# Patient Record
Sex: Female | Born: 1980 | Race: Black or African American | Hispanic: No | Marital: Married | Smoking: Never smoker
Health system: Southern US, Community
[De-identification: ages and names within clinical notes are randomized; demographics above are authoritative.]

## PROBLEM LIST (undated history)

## (undated) ENCOUNTER — Inpatient Hospital Stay (HOSPITAL_COMMUNITY): Payer: Self-pay

## (undated) DIAGNOSIS — Z6791 Unspecified blood type, Rh negative: Secondary | ICD-10-CM

## (undated) DIAGNOSIS — O210 Mild hyperemesis gravidarum: Secondary | ICD-10-CM

## (undated) DIAGNOSIS — O139 Gestational [pregnancy-induced] hypertension without significant proteinuria, unspecified trimester: Secondary | ICD-10-CM

## (undated) DIAGNOSIS — D219 Benign neoplasm of connective and other soft tissue, unspecified: Secondary | ICD-10-CM

## (undated) DIAGNOSIS — E669 Obesity, unspecified: Secondary | ICD-10-CM

## (undated) HISTORY — PX: NO PAST SURGERIES: SHX2092

---

## 2011-12-30 ENCOUNTER — Encounter: Payer: Self-pay | Admitting: *Deleted

## 2011-12-30 ENCOUNTER — Emergency Department
Admission: EM | Admit: 2011-12-30 | Discharge: 2011-12-30 | Disposition: A | Discharge status: Home / Self Care | Payer: Self-pay | Source: Home / Self Care | Attending: Emergency Medicine | Admitting: Emergency Medicine

## 2011-12-30 DIAGNOSIS — N912 Amenorrhea, unspecified: Secondary | ICD-10-CM

## 2011-12-30 LAB — POCT URINE PREGNANCY: Preg Test, Ur: POSITIVE

## 2011-12-30 NOTE — ED Provider Notes (Signed)
History     CSN: [id number]  Arrival date & time 12/30/11  1830   First MD Initiated Contact with Patient 12/30/11 1910      Chief Complaint  Patient presents with  . Possible Pregnancy    HPI 31 year old female, married in June. Gravida 0 para 0. She and husband are not using any contraception.  Complains of amenorrhea. Last menstrual period started 11/29/11 and was the normal 4 days. She normally has her periods very regularly q. 28 days, and it's been around 31 days since the start of her last menstrual period.--She feels she is pregnant. She did a home pregnancy test today that was positive, and she requests confirmation of this today.  She does not have an OB/GYN at this time. Symptoms include some mild breast tenderness, increased hunger. No nausea or vomiting. History reviewed. No pertinent past medical history.  History reviewed. No pertinent past surgical history.  History reviewed. No pertinent family history.  History  Substance Use Topics  . Smoking status: Never Smoker   . Smokeless tobacco: Not on file  . Alcohol Use: No    OB History    Grav Para Term Preterm Abortions TAB SAB Ect Mult Living                  Review of Systems  All other systems reviewed and are negative.    Allergies  Review of patient's allergies indicates no known allergies.  Home Medications  No current outpatient prescriptions on file.  BP 136/83  Pulse 76  Temp 98 F (36.7 C) (Oral)  Resp 18  Ht 5\' 4"  (1.626 m)  Wt 244 lb (110.678 kg)  BMI 41.88 kg/m2  SpO2 100%  LMP 11/29/2011  Physical Exam  Nursing note and vitals reviewed. Constitutional: She is oriented to person, place, and time. She appears well-developed and well-nourished. No distress.  HENT:  Head: Normocephalic and atraumatic.  Eyes: Conjunctivae and EOM are normal. Pupils are equal, round, and reactive to light. No scleral icterus.  Neck: Normal range of motion.  Cardiovascular: Normal rate.     Pulmonary/Chest: Effort normal.  Abdominal: She exhibits no distension.  Musculoskeletal: Normal range of motion.  Neurological: She is alert and oriented to person, place, and time.  Skin: Skin is warm.  Psychiatric: She has a normal mood and affect.   Pleasant female, no distress. ED Course  Procedures (including critical care time)   Labs Reviewed  POCT URINE PREGNANCY     MDM  Urine pregnancy test here in our office is positive. We discussed at length today, 30 minute visit today, more than half for discussion and counseling. She states she is very happy that she's pregnant. Questions invited and answered. Wrote a prescription for her to start on prenatal vitamins with folic acid, one daily. Gave her name and phone number of OB/GYN at [facility], and she will call to get an appointment within one to 2 weeks. If any problems before seeing OB/GYN, return to urgent care or go to ED. She voiced understanding and agreement with above plans.        Lajean Manes, MD 12/30/11 2127

## 2011-12-30 NOTE — ED Notes (Signed)
The pt is here today for pregnancy testing. She reports that she has taken several at home test that were positive.

## 2012-01-27 ENCOUNTER — Encounter: Payer: Self-pay | Admitting: Family

## 2012-01-27 ENCOUNTER — Ambulatory Visit (INDEPENDENT_AMBULATORY_CARE_PROVIDER_SITE_OTHER): Payer: Self-pay | Admitting: Family

## 2012-01-27 ENCOUNTER — Other Ambulatory Visit (HOSPITAL_COMMUNITY)
Admission: RE | Admit: 2012-01-27 | Discharge: 2012-01-27 | Disposition: A | Discharge status: Home / Self Care | Payer: Medicaid Other | Source: Ambulatory Visit | Attending: Family | Admitting: Family

## 2012-01-27 VITALS — BP 114/75 | Wt 242.0 lb

## 2012-01-27 DIAGNOSIS — Z3480 Encounter for supervision of other normal pregnancy, unspecified trimester: Secondary | ICD-10-CM

## 2012-01-27 DIAGNOSIS — Z1151 Encounter for screening for human papillomavirus (HPV): Secondary | ICD-10-CM

## 2012-01-27 DIAGNOSIS — Z113 Encounter for screening for infections with a predominantly sexual mode of transmission: Secondary | ICD-10-CM | POA: Insufficient documentation

## 2012-01-27 DIAGNOSIS — Z3490 Encounter for supervision of normal pregnancy, unspecified, unspecified trimester: Secondary | ICD-10-CM | POA: Insufficient documentation

## 2012-01-27 DIAGNOSIS — Z124 Encounter for screening for malignant neoplasm of cervix: Secondary | ICD-10-CM

## 2012-01-27 DIAGNOSIS — Z01419 Encounter for gynecological examination (general) (routine) without abnormal findings: Secondary | ICD-10-CM | POA: Insufficient documentation

## 2012-01-27 NOTE — Progress Notes (Signed)
Exam  New OB visit; reviewed practice information and OB visit schedule; discussed genetic screening, pt undecided, will decide in next two weeks and will let us know.  Obtain OB panel today.  Plans to switch prenatal vitamins to gummies and taking at night.    Uterine Size: size equals dates  Pelvic Exam:    Perineum: No Hemorrhoids, Normal Perineum   Vulva: normal   Vagina:  normal mucosa, normal discharge, no palpable nodules   pH: Not done   Cervix: no bleeding following Pap, no cervical motion tenderness and no lesions   Adnexa: normal adnexa and no mass, fullness, tenderness   Bony Pelvis: Adequate  System: Breast:  Large; no nipple retraction or dimpling, No nipple discharge or bleeding, No axillary or supraclavicular adenopathy, Normal to palpation without dominant masses   Skin: normal coloration and turgor, no rashes    Neurologic: negative   Extremities: normal strength, tone, and muscle mass   HEENT neck supple with midline trachea and thyroid without masses   Mouth/Teeth mucous membranes moist, pharynx normal without lesions   Neck supple and no masses   Cardiovascular: regular rate and rhythm, no murmurs or gallops   Respiratory:  appears well, vitals normal, no respiratory distress, acyanotic, normal RR, neck free of mass or lymphadenopathy, chest clear, no wheezing, crepitations, rhonchi, normal symmetric air entry   Abdomen: soft, non-tender; bowel sounds normal; no masses,  no organomegaly   Urinary: urethral meatus normal

## 2012-01-27 NOTE — Progress Notes (Signed)
p-73 

## 2012-01-28 LAB — OBSTETRIC PANEL
Basophils Absolute: 0 10*3/uL (ref 0.0–0.1)
Eosinophils Relative: 1 % (ref 0–5)
Lymphocytes Relative: 17 % (ref 12–46)
Neutro Abs: 5.6 10*3/uL (ref 1.7–7.7)
Platelets: 358 10*3/uL (ref 150–400)
RDW: 14.6 % (ref 11.5–15.5)
Rubella: 29.3 IU/mL — ABNORMAL HIGH
WBC: 7.5 10*3/uL (ref 4.0–10.5)

## 2012-01-28 LAB — SICKLE CELL SCREEN: Sickle Cell Screen: NEGATIVE

## 2012-01-29 ENCOUNTER — Encounter: Payer: Self-pay | Admitting: Family

## 2012-01-29 DIAGNOSIS — O26899 Other specified pregnancy related conditions, unspecified trimester: Secondary | ICD-10-CM

## 2012-01-29 DIAGNOSIS — Z6791 Unspecified blood type, Rh negative: Secondary | ICD-10-CM | POA: Insufficient documentation

## 2012-02-01 LAB — CYSTIC FIBROSIS DIAGNOSTIC STUDY: Date of Birth: [birth year]

## 2012-02-03 ENCOUNTER — Encounter: Payer: Self-pay | Admitting: Family

## 2012-02-03 DIAGNOSIS — IMO0002 Reserved for concepts with insufficient information to code with codable children: Secondary | ICD-10-CM | POA: Insufficient documentation

## 2012-02-08 ENCOUNTER — Telehealth: Payer: Self-pay | Admitting: *Deleted

## 2012-02-08 DIAGNOSIS — O219 Vomiting of pregnancy, unspecified: Secondary | ICD-10-CM

## 2012-02-08 MED ORDER — ONDANSETRON HCL 8 MG PO TABS: 8.0000 mg | ORAL_TABLET | Freq: Three times a day (TID) | ORAL | Status: DC | PRN

## 2012-02-08 NOTE — Telephone Encounter (Signed)
Pt called with c/o's nausea and vomiting for 3 days.  She said she has tried everything on the list of approved medications and nothing has worked.  She states that she is afebrile and is voiding.  She is requesting something for N and V.  RX for Zofran sent to [facility].  Informed pt that if she continues to have issues after taking the Zofran, we would suggest going to [facility] for possible IV fluids.

## 2012-02-23 ENCOUNTER — Encounter: Payer: Self-pay | Admitting: Obstetrics & Gynecology

## 2012-02-23 ENCOUNTER — Ambulatory Visit: Payer: Medicaid Other | Admitting: Obstetrics & Gynecology

## 2012-02-23 ENCOUNTER — Ambulatory Visit (INDEPENDENT_AMBULATORY_CARE_PROVIDER_SITE_OTHER): Payer: Medicaid Other | Admitting: Obstetrics & Gynecology

## 2012-02-23 VITALS — BP 117/68 | HR 77 | Resp 16 | Ht 65.0 in | Wt 244.0 lb

## 2012-02-23 VITALS — BP 117/68 | Temp 98.4°F | Wt 244.0 lb

## 2012-02-23 DIAGNOSIS — IMO0002 Reserved for concepts with insufficient information to code with codable children: Secondary | ICD-10-CM

## 2012-02-23 DIAGNOSIS — R87612 Low grade squamous intraepithelial lesion on cytologic smear of cervix (LGSIL): Secondary | ICD-10-CM

## 2012-02-23 DIAGNOSIS — Z3480 Encounter for supervision of other normal pregnancy, unspecified trimester: Secondary | ICD-10-CM

## 2012-02-23 NOTE — Progress Notes (Signed)
p-77 

## 2012-02-23 NOTE — Progress Notes (Signed)
Patient ID: Rebekah Fowler, female   DOB: [birth year], 31 y.o.   MRN: [id number]  Chief Complaint  Patient presents with  . Colposcopy    pt is pregnant    HPI Rebekah Fowler is a 31 y.o. female.  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  No prior abnml paps History reviewed. No pertinent past medical history.  History reviewed. No pertinent past surgical history.  Family History  Problem Relation Age of Onset  . Hypertension Mother   . Cancer Sister 32    breast  . Diabetes Father     Social History History  Substance Use Topics  . Smoking status: Never Smoker   . Smokeless tobacco: Never Used  . Alcohol Use: No    Allergies  Allergen Reactions  . Latex Itching and Rash    Current Outpatient Prescriptions  Medication Sig Dispense Refill  . ondansetron (ZOFRAN) 8 MG tablet Take 1 tablet (8 mg total) by mouth every 8 (eight) hours as needed for nausea.  20 tablet  0  . PRENATAL VITAMINS PO Take by mouth daily.        Review of Systems Review of Systems  Blood pressure 117/68, pulse 77, resp. rate 16, height 5\' 5"  (1.651 m), weight 244 lb (110.678 kg), last menstrual period 11/29/2011.  Physical Exam Physical Exam Nml BP + FH No problems with pregnancy  Assessment    Procedure Details  The risks and benefits of the procedure and Written informed consent obtained.  Speculum placed in vagina and excellent visualization of cervix achieved, cervix swabbed x 3 with acetic acid solution.  Specimens: biopsy at 6 o'clock  Complications: none.  Plan    Specimens labelled and sent to Pathology. Return to discuss Pathology at next prenatal visit. Pt refuses genetic testing. Needs anatomy at 18 weeks.      Rebekah Besecker H. 02/23/2012, 3:38 PM

## 2012-02-23 NOTE — Addendum Note (Signed)
Addended by: Granville Lewis on: 02/23/2012 04:35 PM   Modules accepted: Orders

## 2012-03-01 ENCOUNTER — Encounter: Payer: Self-pay | Admitting: Obstetrics & Gynecology

## 2012-03-01 ENCOUNTER — Other Ambulatory Visit: Payer: Self-pay | Admitting: *Deleted

## 2012-03-01 DIAGNOSIS — O219 Vomiting of pregnancy, unspecified: Secondary | ICD-10-CM

## 2012-03-01 MED ORDER — ONDANSETRON HCL 8 MG PO TABS: 8.0000 mg | ORAL_TABLET | Freq: Three times a day (TID) | ORAL | Status: DC | PRN

## 2012-03-01 NOTE — Telephone Encounter (Signed)
Pt called requesting a RF on Zofran as she had ran out and cannot keep anything down.  Pt denies any diarrhea or fever.  RF given for Zofran and instructed pt to call if after 12 hours of taking the Zofran she still cannot keep anything down.

## 2012-03-22 ENCOUNTER — Encounter: Payer: Self-pay | Admitting: Obstetrics & Gynecology

## 2012-03-22 ENCOUNTER — Ambulatory Visit (INDEPENDENT_AMBULATORY_CARE_PROVIDER_SITE_OTHER): Payer: Medicaid Other | Admitting: Obstetrics & Gynecology

## 2012-03-22 VITALS — BP 124/79 | Temp 96.9°F | Wt 245.0 lb

## 2012-03-22 DIAGNOSIS — Z23 Encounter for immunization: Secondary | ICD-10-CM

## 2012-03-22 DIAGNOSIS — Z3480 Encounter for supervision of other normal pregnancy, unspecified trimester: Secondary | ICD-10-CM

## 2012-03-22 DIAGNOSIS — Z3490 Encounter for supervision of normal pregnancy, unspecified, unspecified trimester: Secondary | ICD-10-CM

## 2012-03-22 DIAGNOSIS — O99210 Obesity complicating pregnancy, unspecified trimester: Secondary | ICD-10-CM | POA: Insufficient documentation

## 2012-03-22 NOTE — Progress Notes (Signed)
p-88  Pt is undecided about Quad screen

## 2012-03-22 NOTE — Progress Notes (Signed)
Routine visit. No problems. She reports FM. She would like a Quad screen at next visit. I will schedule an anatomy u/s in 2 weeks. Flu vaccine today. We have discussed a recommended weight gain of 15#. She will get an early 1 hour glucola at next visit.

## 2012-03-24 ENCOUNTER — Emergency Department (HOSPITAL_COMMUNITY): Payer: Medicaid Other

## 2012-03-24 ENCOUNTER — Encounter (HOSPITAL_COMMUNITY): Payer: Self-pay | Admitting: *Deleted

## 2012-03-24 ENCOUNTER — Emergency Department (HOSPITAL_COMMUNITY)
Admission: EM | Admit: 2012-03-24 | Discharge: 2012-03-24 | Disposition: A | Discharge status: Home / Self Care | Payer: Medicaid Other | Attending: Emergency Medicine | Admitting: Emergency Medicine

## 2012-03-24 DIAGNOSIS — R1010 Upper abdominal pain, unspecified: Secondary | ICD-10-CM

## 2012-03-24 DIAGNOSIS — R112 Nausea with vomiting, unspecified: Secondary | ICD-10-CM | POA: Insufficient documentation

## 2012-03-24 DIAGNOSIS — O99891 Other specified diseases and conditions complicating pregnancy: Secondary | ICD-10-CM | POA: Insufficient documentation

## 2012-03-24 DIAGNOSIS — Z3490 Encounter for supervision of normal pregnancy, unspecified, unspecified trimester: Secondary | ICD-10-CM

## 2012-03-24 DIAGNOSIS — R109 Unspecified abdominal pain: Secondary | ICD-10-CM | POA: Insufficient documentation

## 2012-03-24 LAB — CBC WITH DIFFERENTIAL/PLATELET
Basophils Absolute: 0 10*3/uL (ref 0.0–0.1)
Eosinophils Relative: 1 % (ref 0–5)
HCT: 34.8 % — ABNORMAL LOW (ref 36.0–46.0)
Lymphocytes Relative: 14 % (ref 12–46)
MCV: 78.7 fL (ref 78.0–100.0)
Monocytes Absolute: 0.8 10*3/uL (ref 0.1–1.0)
Monocytes Relative: 8 % (ref 3–12)
RDW: 13.7 % (ref 11.5–15.5)
WBC: 10.4 10*3/uL (ref 4.0–10.5)

## 2012-03-24 LAB — COMPREHENSIVE METABOLIC PANEL
BUN: 4 mg/dL — ABNORMAL LOW (ref 6–23)
CO2: 20 mEq/L (ref 19–32)
Calcium: 8.5 mg/dL (ref 8.4–10.5)
Creatinine, Ser: 0.52 mg/dL (ref 0.50–1.10)
GFR calc Af Amer: >90 mL/min (ref >90)
GFR calc non Af Amer: >90 mL/min (ref >90)
Glucose, Bld: 104 mg/dL — ABNORMAL HIGH (ref 70–99)

## 2012-03-24 LAB — AMYLASE: Amylase: 81 U/L (ref 0–105)

## 2012-03-24 LAB — LIPASE, BLOOD: Lipase: 18 U/L (ref 11–59)

## 2012-03-24 MED ORDER — MORPHINE SULFATE 4 MG/ML IJ SOLN
4.0000 mg | Freq: Once | INTRAMUSCULAR | Status: AC
  Administered 2012-03-24: 4 mg via INTRAVENOUS
  Filled 2012-03-24: qty 1

## 2012-03-24 MED ORDER — ONDANSETRON HCL 4 MG/2ML IJ SOLN
4.0000 mg | Freq: Once | INTRAMUSCULAR | Status: AC
  Administered 2012-03-24: 4 mg via INTRAVENOUS
  Filled 2012-03-24: qty 2

## 2012-03-24 NOTE — ED Notes (Signed)
Pt appears in great pain.  Hyperventilating.  Dr Lorenso Courier notified.

## 2012-03-24 NOTE — ED Provider Notes (Signed)
History     CSN: [id number]  Arrival date & time 03/24/12  0220   First MD Initiated Contact with Patient 03/24/12 0225      Chief Complaint  Patient presents with  . Abdominal Pain    (Consider location/radiation/quality/duration/timing/severity/associated sxs/prior treatment) HPI Comments: [name] presents ambulatory with her husband for evaluation.  She is a G1P0 and states she is 4 mo into her pregnancy.  She denies vaginal bleeding, fluid leakage, discharges, lower abdominal or back pain, dysuria, or pyuria.  She states she has had no complications with this pregnancy.  She reports a severe cramping upper abdominal pain associated with nausea.  She vomited once and describes the emesis as nonbloody and nonbilious.  She denies chest pain or respiratory complaints.  Patient is a 31 y.o. female presenting with abdominal pain. The history is provided by the patient. No language interpreter was used.  Abdominal Pain The primary symptoms of the illness include abdominal pain, nausea and vomiting. The primary symptoms of the illness do not include fever, fatigue, shortness of breath, diarrhea, hematemesis, hematochezia, dysuria, vaginal discharge or vaginal bleeding. The current episode started 1 to 2 hours ago. The onset of the illness was gradual. The problem has been gradually worsening.  The patient states that she believes she is currently pregnant. The patient has not had a change in bowel habit. Risk factors: no previous abdominal surgeries. Symptoms associated with the illness do not include chills, anorexia, diaphoresis, heartburn, constipation, urgency, hematuria, frequency or back pain. Significant associated medical issues do not include PUD, GERD, inflammatory bowel disease, diabetes, sickle cell disease, gallstones, liver disease, substance abuse, diverticulitis, HIV or cardiac disease.    History reviewed. No pertinent past medical history.  History reviewed. No pertinent  past surgical history.  Family History  Problem Relation Age of Onset  . Hypertension Mother   . Cancer Sister 62    breast  . Diabetes Father     History  Substance Use Topics  . Smoking status: Never Smoker   . Smokeless tobacco: Never Used  . Alcohol Use: No    OB History    Grav Para Term Preterm Abortions TAB SAB Ect Mult Living   1               Review of Systems  Constitutional: Negative for fever, chills, diaphoresis and fatigue.  HENT: Negative.   Eyes: Negative.   Respiratory: Negative for shortness of breath.   Gastrointestinal: Positive for nausea, vomiting and abdominal pain. Negative for heartburn, diarrhea, constipation, hematochezia, anorexia and hematemesis.  Genitourinary: Negative for dysuria, urgency, frequency, hematuria, vaginal bleeding, vaginal discharge, difficulty urinating and pelvic pain.  Musculoskeletal: Negative for myalgias, back pain, joint swelling, arthralgias and gait problem.  Skin: Negative for color change, pallor, rash and wound.  Neurological: Negative.  Negative for dizziness, syncope, weakness and light-headedness.  Hematological: Negative for adenopathy. Does not bruise/bleed easily.  Psychiatric/Behavioral: Negative.     Allergies  Latex  Home Medications   Current Outpatient Rx  Name  Route  Sig  Dispense  Refill  . ONDANSETRON HCL 8 MG PO TABS   Oral   Take 1 tablet (8 mg total) by mouth every 8 (eight) hours as needed for nausea.   30 tablet   1   . PRENATAL VITAMINS PO   Oral   Take 1 tablet by mouth daily.            BP 108/65  Pulse 77  Temp 97.5  F (36.4 C) (Oral)  Resp 18  SpO2 100%  LMP 11/29/2011  Physical Exam  Nursing note and vitals reviewed. Constitutional: She is oriented to person, place, and time. She appears well-developed and well-nourished. No distress.  HENT:  Head: Normocephalic and atraumatic.  Right Ear: External ear normal.  Left Ear: External ear normal.  Nose: Nose normal.    Mouth/Throat: Oropharynx is clear and moist. No oropharyngeal exudate.  Eyes: Conjunctivae normal are normal. Pupils are equal, round, and reactive to light. Right eye exhibits no discharge. Left eye exhibits no discharge. No scleral icterus.  Neck: Normal range of motion. Neck supple. No JVD present. No tracheal deviation present.  Cardiovascular: Normal rate, normal heart sounds and intact distal pulses.  Exam reveals no gallop and no friction rub.   No murmur heard. Pulmonary/Chest: Effort normal and breath sounds normal. No stridor. No respiratory distress. She has no wheezes. She has no rales. She exhibits no tenderness.  Abdominal: Soft. Bowel sounds are normal. She exhibits no distension and no mass. There is no hepatosplenomegaly. There is tenderness in the right upper quadrant, epigastric area and left upper quadrant. There is no rigidity, no rebound, no guarding, no CVA tenderness and no tenderness at McBurney's point. No hernia.  Musculoskeletal: Normal range of motion. She exhibits no edema and no tenderness.  Lymphadenopathy:    She has no cervical adenopathy.  Neurological: She is alert and oriented to person, place, and time.  Skin: Skin is warm and dry. No rash noted. She is not diaphoretic. No erythema. No pallor.  Psychiatric: She has a normal mood and affect. Her behavior is normal.    ED Course  Procedures (including critical care time)  Labs Reviewed  CBC WITH DIFFERENTIAL - Abnormal; Notable for the following:    Hemoglobin 11.9 (*)     HCT 34.8 (*)     Neutro Abs 7.9 (*)     All other components within normal limits  COMPREHENSIVE METABOLIC PANEL - Abnormal; Notable for the following:    Sodium 133 (*)     Glucose, Bld 104 (*)     BUN 4 (*)     Albumin 3.0 (*)     Total Bilirubin 0.1 (*)     All other components within normal limits  LIPASE, BLOOD  AMYLASE   No results found.   No diagnosis found.    MDM  Pt is 4 mos pregnant (with already  established Ob care) and presents for evaluation of upper abdominal pain since 0100.  She appears stable but  Uncomfortable.  She denies bleeding and has no lowe abdominal tenderness on exam.  There is a fetal heart beat in the 140s also.  Will treat her discomfort while awaiting the results of routine belly labs.  Secondary to the location of the pain, will likely obtain an abdominal ultrasound to assess for gallstones or cholecystitis.  1610.  Pt stable, Pain is resolved.  Plan discharge home.  She has a single live IUP at 18 wk 1 day and no evidence of gallstones or cholecystitis.  There is also no evidence of peritonitis or uterine irritability.       Tobin Chad, MD 03/24/12 [phone]

## 2012-03-24 NOTE — ED Notes (Signed)
Pt back from [place].  Denies any pain at this time.

## 2012-03-24 NOTE — ED Notes (Signed)
Pt to ED with acute onset epigastric pain since 1 am.  Pt has been vomiting d/t pregnancy (4 months), but she feels her emesis before arriving in ED was d/t pain.  Pt had last check-up Thurs and baby is doing fine.  No bleeding, cramping or vaginal discharge.  No hx of heartburn.

## 2012-03-24 NOTE — ED Notes (Signed)
Patient transported to Ultrasound 

## 2012-04-06 ENCOUNTER — Ambulatory Visit (HOSPITAL_COMMUNITY)
Admission: RE | Admit: 2012-04-06 | Discharge: 2012-04-06 | Disposition: A | Discharge status: Home / Self Care | Payer: Medicaid Other | Source: Ambulatory Visit | Attending: Obstetrics & Gynecology | Admitting: Obstetrics & Gynecology

## 2012-04-06 DIAGNOSIS — Z3490 Encounter for supervision of normal pregnancy, unspecified, unspecified trimester: Secondary | ICD-10-CM

## 2012-04-06 DIAGNOSIS — Z363 Encounter for antenatal screening for malformations: Secondary | ICD-10-CM | POA: Insufficient documentation

## 2012-04-06 DIAGNOSIS — Z1389 Encounter for screening for other disorder: Secondary | ICD-10-CM | POA: Insufficient documentation

## 2012-04-06 DIAGNOSIS — O358XX0 Maternal care for other (suspected) fetal abnormality and damage, not applicable or unspecified: Secondary | ICD-10-CM | POA: Insufficient documentation

## 2012-04-10 ENCOUNTER — Encounter: Payer: Self-pay | Admitting: Obstetrics & Gynecology

## 2012-04-20 ENCOUNTER — Ambulatory Visit (INDEPENDENT_AMBULATORY_CARE_PROVIDER_SITE_OTHER): Payer: Medicaid Other | Admitting: Advanced Practice Midwife

## 2012-04-20 ENCOUNTER — Other Ambulatory Visit: Payer: Self-pay | Admitting: *Deleted

## 2012-04-20 VITALS — BP 110/70 | Wt 253.0 lb

## 2012-04-20 DIAGNOSIS — O219 Vomiting of pregnancy, unspecified: Secondary | ICD-10-CM

## 2012-04-20 DIAGNOSIS — D259 Leiomyoma of uterus, unspecified: Secondary | ICD-10-CM

## 2012-04-20 DIAGNOSIS — Z3490 Encounter for supervision of normal pregnancy, unspecified, unspecified trimester: Secondary | ICD-10-CM

## 2012-04-20 DIAGNOSIS — O99210 Obesity complicating pregnancy, unspecified trimester: Secondary | ICD-10-CM

## 2012-04-20 DIAGNOSIS — E669 Obesity, unspecified: Secondary | ICD-10-CM

## 2012-04-20 DIAGNOSIS — Z3480 Encounter for supervision of other normal pregnancy, unspecified trimester: Secondary | ICD-10-CM

## 2012-04-20 LAB — GLUCOSE TOLERANCE, 1 HOUR (50G) W/O FASTING: Glucose, 1 Hour GTT: 104 mg/dL (ref 70–140)

## 2012-04-20 MED ORDER — ONDANSETRON HCL 8 MG PO TABS: 8.0000 mg | ORAL_TABLET | Freq: Three times a day (TID) | ORAL | Status: DC | PRN

## 2012-04-20 NOTE — Progress Notes (Signed)
C/O right hip and buttock pain C/W sciatica. Comfort measures, Ibuprofen OK 2nd trimester. Normal anatomy scan. Early 1 hour GTT today.

## 2012-04-20 NOTE — Progress Notes (Signed)
p-84 

## 2012-04-20 NOTE — Patient Instructions (Signed)
[facility] (childbirth and breastfeeding classes)  Ibuprofen 600 mg every 6 hours as needed. Do not take after 28 weeks of pregnancy.   Breastfeeding Deciding to breastfeed is one of the best choices you can make for you and your baby. The information that follows gives a brief overview of the benefits of breastfeeding as well as common topics surrounding breastfeeding. BENEFITS OF BREASTFEEDING For the baby  The first milk (colostrum) helps the baby's digestive system function better.   There are antibodies in the mother's milk that help the baby fight off infections.   The baby has a lower incidence of asthma, allergies, and sudden infant death syndrome (SIDS).   The nutrients in breast milk are better for the baby than infant formulas, and breast milk helps the baby's brain grow better.   Babies who breastfeed have less gas, colic, and constipation.  For the mother  Breastfeeding helps develop a very special bond between the mother and her baby.   Breastfeeding is convenient, always available at the correct temperature, and costs nothing.   Breastfeeding burns calories in the mother and helps her lose weight that was gained during pregnancy.   Breastfeeding makes the uterus contract back down to normal size faster and slows bleeding following delivery.   Breastfeeding mothers have a lower risk of developing breast cancer.  BREASTFEEDING FREQUENCY  A healthy, full-term baby may breastfeed as often as every hour or space his or her feedings to every 3 hours.   Watch your baby for signs of hunger. Nurse your baby if he or she shows signs of hunger. How often you nurse will vary from baby to baby.   Nurse as often as the baby requests, or when you feel the need to reduce the fullness of your breasts.   Awaken the baby if it has been 3 4 hours since the last feeding.   Frequent feeding will help the mother make more milk and will help prevent problems,  such as sore nipples and engorgement of the breasts.  BABY'S POSITION AT THE BREAST  Whether lying down or sitting, be sure that the baby's tummy is facing your tummy.   Support the breast with 4 fingers underneath the breast and the thumb above. Make sure your fingers are well away from the nipple and baby's mouth.   Stroke the baby's lips gently with your finger or nipple.   When the baby's mouth is open wide enough, place all of your nipple and as much of the areola as possible into your baby's mouth.   Pull the baby in close so the tip of the nose and the baby's cheeks touch the breast during the feeding.  FEEDINGS AND SUCTION  The length of each feeding varies from baby to baby and from feeding to feeding.   The baby must suck about 2 3 minutes for your milk to get to him or her. This is called a "let down." For this reason, allow the baby to feed on each breast as long as he or she wants. Your baby will end the feeding when he or she has received the right balance of nutrients.   To break the suction, put your finger into the corner of the baby's mouth and slide it between his or her gums before removing your breast from his or her mouth. This will help prevent sore nipples.  HOW TO TELL WHETHER YOUR BABY IS GETTING ENOUGH BREAST MILK. Wondering whether or not your baby is  getting enough milk is a common concern among mothers. You can be assured that your baby is getting enough milk if:   Your baby is actively sucking and you hear swallowing.   Your baby seems relaxed and satisfied after a feeding.   Your baby nurses at least 8 12 times in a 24 hour time period. Nurse your baby until he or she unlatches or falls asleep at the first breast (at least 10 20 minutes), then offer the second side.   Your baby is wetting 5 6 disposable diapers (6 8 cloth diapers) in a 24 hour period by 20 16 days of age.   Your baby is having at least 3 4 stools every 24 hours for the first 6  weeks. The stool should be soft and yellow.   Your baby should gain 4 7 ounces per week after he or she is 20 days old.   Your breasts feel softer after nursing.  REDUCING BREAST ENGORGEMENT  In the first week after your baby is born, you may experience signs of breast engorgement. When breasts are engorged, they feel heavy, warm, full, and may be tender to the touch. You can reduce engorgement if you:   Nurse frequently, every 2 3 hours. Mothers who breastfeed early and often have fewer problems with engorgement.   Place light ice packs on your breasts for 10 20 minutes between feedings. This reduces swelling. Wrap the ice packs in a lightweight towel to protect your skin. Bags of frozen vegetables work well for this purpose.   Take a warm shower or apply warm, moist heat to your breast for 5 10 minutes just before each feeding. This increases circulation and helps the milk flow.   Gently massage your breast before and during the feeding. Using your finger tips, massage from the chest wall towards your nipple in a circular motion.   Make sure that the baby empties at least one breast at every feeding before switching sides.   Use a breast pump to empty the breasts if your baby is sleepy or not nursing well. You may also want to pump if you are returning to work oryou feel you are getting engorged.   Avoid bottle feeds, pacifiers, or supplemental feedings of water or juice in place of breastfeeding. Breast milk is all the food your baby needs. It is not necessary for your baby to have water or formula. In fact, to help your breasts make more milk, it is best not to give your baby supplemental feedings during the early weeks.   Be sure the baby is latched on and positioned properly while breastfeeding.   Wear a supportive bra, avoiding underwire styles.   Eat a balanced diet with enough fluids.   Rest often, relax, and take your prenatal vitamins to prevent fatigue, stress,  and anemia.  If you follow these suggestions, your engorgement should improve in 24 48 hours. If you are still experiencing difficulty, call your lactation consultant or caregiver.  CARING FOR YOURSELF Take care of your breasts  Bathe or shower daily.   Avoid using soap on your nipples.   Start feedings on your left breast at one feeding and on your right breast at the next feeding.   You will notice an increase in your milk supply 2 5 days after delivery. You may feel some discomfort from engorgement, which makes your breasts very firm and often tender. Engorgement "peaks" out within 24 48 hours. In the meantime, apply warm moist towels  to your breasts for 5 10 minutes before feeding. Gentle massage and expression of some milk before feeding will soften your breasts, making it easier for your baby to latch on.   Wear a well-fitting nursing bra, and air dry your nipples for a 3 after each feeding.   Only use cotton bra pads.   Only use pure lanolin on your nipples after nursing. You do not need to wash it off before feeding the baby again. Another option is to express a few drops of breast milk and gently massage it into your nipples.  Take care of yourself  Eat well-balanced meals and nutritious snacks.   Drinking milk, fruit juice, and water to satisfy your thirst (about 8 glasses a day).   Get plenty of rest.  Avoid foods that you notice affect the baby in a bad way.  SEEK MEDICAL CARE IF:   You have difficulty with breastfeeding and need help.   You have a hard, red, sore area on your breast that is accompanied by a fever.   Your baby is too sleepy to eat well or is having trouble sleeping.   Your baby is wetting less than 6 diapers a day, by 79 days of age.   Your baby's skin or white part of his or her eyes is more yellow than it was in the hospital.   You feel depressed.  Document Released: 05/02/2005 Document Revised: 11/01/2011 Document  Reviewed: 07/31/2011 [facility] Patient Information 2013 [place], [place].   Sciatica Sciatica is pain, weakness, numbness, or tingling along the path of the sciatic nerve. The nerve starts in the lower back and runs down the back of each leg. The nerve controls the muscles in the lower leg and in the back of the knee, while also providing sensation to the back of the thigh, lower leg, and the sole of your foot. Sciatica is a symptom of another medical condition. For instance, nerve damage or certain conditions, such as a herniated disk or bone spur on the spine, pinch or put pressure on the sciatic nerve. This causes the pain, weakness, or other sensations normally associated with sciatica. Generally, sciatica only affects one side of the body. CAUSES   Herniated or slipped disc.  Degenerative disk disease.  A pain disorder involving the narrow muscle in the buttocks (piriformis syndrome).  Pelvic injury or fracture.  Pregnancy.  Tumor (rare). SYMPTOMS  Symptoms can vary from mild to very severe. The symptoms usually travel from the low back to the buttocks and down the back of the leg. Symptoms can include:  Mild tingling or dull aches in the lower back, leg, or hip.  Numbness in the back of the calf or sole of the foot.  Burning sensations in the lower back, leg, or hip.  Sharp pains in the lower back, leg, or hip.  Leg weakness.  Severe back pain inhibiting movement. These symptoms may get worse with coughing, sneezing, laughing, or prolonged sitting or standing. Also, being overweight may worsen symptoms. DIAGNOSIS  Your caregiver will perform a physical exam to look for common symptoms of sciatica. He or she may ask you to do certain movements or activities that would trigger sciatic nerve pain. Other tests may be performed to find the cause of the sciatica. These may include:  Blood tests.  X-rays.  Imaging tests, such as an MRI or CT scan. TREATMENT  Treatment is  directed at the cause of the sciatic pain. Sometimes, treatment is not necessary and  the pain and discomfort goes away on its own. If treatment is needed, your caregiver may suggest:  Over-the-counter medicines to relieve pain.  Prescription medicines, such as anti-inflammatory medicine, muscle relaxants, or narcotics.  Applying heat or ice to the painful area.  Steroid injections to lessen pain, irritation, and inflammation around the nerve.  Reducing activity during periods of pain.  Exercising and stretching to strengthen your abdomen and improve flexibility of your spine. Your caregiver may suggest losing weight if the extra weight makes the back pain worse.  Physical therapy.  Surgery to eliminate what is pressing or pinching the nerve, such as a bone spur or part of a herniated disk. HOME CARE INSTRUCTIONS   Only take over-the-counter or prescription medicines for pain or discomfort as directed by your caregiver.  Apply ice to the affected area for 20 minutes, 3 4 times a day for the first 48 72 hours. Then try heat in the same way.  Exercise, stretch, or perform your usual activities if these do not aggravate your pain.  Attend physical therapy sessions as directed by your caregiver.  Keep all follow-up appointments as directed by your caregiver.  Do not wear high heels or shoes that do not provide proper support.  Check your mattress to see if it is too soft. A firm mattress may lessen your pain and discomfort. SEEK IMMEDIATE MEDICAL CARE IF:   You lose control of your bowel or bladder (incontinence).  You have increasing weakness in the lower back, pelvis, buttocks, or legs.  You have redness or swelling of your back.  You have a burning sensation when you urinate.  You have pain that gets worse when you lie down or awakens you at night.  Your pain is worse than you have experienced in the past.  Your pain is lasting longer than 4 weeks.  You are suddenly  losing weight without reason. MAKE SURE YOU:  Understand these instructions.  Will watch your condition.  Will get help right away if you are not doing well or get worse. Document Released: 04/26/2001 Document Revised: 11/01/2011 Document Reviewed: 09/11/2011 [facility] Patient Information 2013 [place], [place].

## 2012-04-23 ENCOUNTER — Telehealth: Payer: Self-pay | Admitting: *Deleted

## 2012-04-23 NOTE — Telephone Encounter (Signed)
Lm on voicemail that she did pass her early 1 hr GTT.

## 2012-04-24 ENCOUNTER — Encounter: Payer: Self-pay | Admitting: Advanced Practice Midwife

## 2012-05-18 ENCOUNTER — Ambulatory Visit (INDEPENDENT_AMBULATORY_CARE_PROVIDER_SITE_OTHER): Payer: Medicaid Other | Admitting: Advanced Practice Midwife

## 2012-05-18 ENCOUNTER — Encounter: Payer: Self-pay | Admitting: *Deleted

## 2012-05-18 VITALS — BP 110/79 | Wt 254.0 lb

## 2012-05-18 DIAGNOSIS — Z3480 Encounter for supervision of other normal pregnancy, unspecified trimester: Secondary | ICD-10-CM

## 2012-05-18 DIAGNOSIS — Z3490 Encounter for supervision of normal pregnancy, unspecified, unspecified trimester: Secondary | ICD-10-CM

## 2012-05-18 DIAGNOSIS — D259 Leiomyoma of uterus, unspecified: Secondary | ICD-10-CM

## 2012-05-18 NOTE — Progress Notes (Signed)
Review normal early GTT, plan for repeat at 28 weeks. Plans BTL. R/B reviewed including bleeding, infection, damage to nearby organs and regret. Husband has two children. Pt extremely sure even though this is her first child. Consent signed. Declined quad at last visit.

## 2012-05-18 NOTE — Progress Notes (Signed)
p-85 

## 2012-05-30 ENCOUNTER — Encounter: Payer: Self-pay | Admitting: Obstetrics & Gynecology

## 2012-05-30 DIAGNOSIS — Z3009 Encounter for other general counseling and advice on contraception: Secondary | ICD-10-CM | POA: Insufficient documentation

## 2012-06-14 ENCOUNTER — Inpatient Hospital Stay (HOSPITAL_COMMUNITY)
Admission: AD | Admit: 2012-06-14 | Discharge: 2012-06-14 | Disposition: A | Discharge status: Home / Self Care | Payer: Medicaid Other | Source: Ambulatory Visit | Attending: Obstetrics & Gynecology | Admitting: Obstetrics & Gynecology

## 2012-06-14 ENCOUNTER — Encounter (HOSPITAL_COMMUNITY): Payer: Self-pay | Admitting: *Deleted

## 2012-06-14 DIAGNOSIS — Z3490 Encounter for supervision of normal pregnancy, unspecified, unspecified trimester: Secondary | ICD-10-CM

## 2012-06-14 DIAGNOSIS — IMO0002 Reserved for concepts with insufficient information to code with codable children: Secondary | ICD-10-CM

## 2012-06-14 DIAGNOSIS — Z3009 Encounter for other general counseling and advice on contraception: Secondary | ICD-10-CM

## 2012-06-14 DIAGNOSIS — O99891 Other specified diseases and conditions complicating pregnancy: Secondary | ICD-10-CM | POA: Insufficient documentation

## 2012-06-14 DIAGNOSIS — K920 Hematemesis: Secondary | ICD-10-CM | POA: Insufficient documentation

## 2012-06-14 DIAGNOSIS — R109 Unspecified abdominal pain: Secondary | ICD-10-CM | POA: Insufficient documentation

## 2012-06-14 DIAGNOSIS — Z6791 Unspecified blood type, Rh negative: Secondary | ICD-10-CM

## 2012-06-14 DIAGNOSIS — O212 Late vomiting of pregnancy: Secondary | ICD-10-CM | POA: Insufficient documentation

## 2012-06-14 DIAGNOSIS — K226 Gastro-esophageal laceration-hemorrhage syndrome: Secondary | ICD-10-CM | POA: Insufficient documentation

## 2012-06-14 DIAGNOSIS — K259 Gastric ulcer, unspecified as acute or chronic, without hemorrhage or perforation: Secondary | ICD-10-CM | POA: Insufficient documentation

## 2012-06-14 DIAGNOSIS — K2970 Gastritis, unspecified, without bleeding: Secondary | ICD-10-CM | POA: Insufficient documentation

## 2012-06-14 DIAGNOSIS — O99210 Obesity complicating pregnancy, unspecified trimester: Secondary | ICD-10-CM

## 2012-06-14 LAB — URINALYSIS, ROUTINE W REFLEX MICROSCOPIC
Bilirubin Urine: NEGATIVE
Hgb urine dipstick: NEGATIVE
Ketones, ur: 15 mg/dL — AB
Nitrite: NEGATIVE
Urobilinogen, UA: 1 mg/dL (ref 0.0–1.0)

## 2012-06-14 LAB — CBC
MCH: 26.6 pg (ref 26.0–34.0)
MCHC: 33 g/dL (ref 30.0–36.0)
MCV: 80.7 fL (ref 78.0–100.0)
Platelets: 339 10*3/uL (ref 150–400)

## 2012-06-14 MED ORDER — FAMOTIDINE 20 MG PO TABS
40.0000 mg | ORAL_TABLET | Freq: Once | ORAL | Status: AC
  Administered 2012-06-14: 40 mg via ORAL
  Filled 2012-06-14: qty 2

## 2012-06-14 MED ORDER — PANTOPRAZOLE SODIUM 40 MG PO TBEC: 40.0000 mg | DELAYED_RELEASE_TABLET | Freq: Every day | ORAL | Status: DC

## 2012-06-14 MED ORDER — GI COCKTAIL ~~LOC~~
30.0000 mL | Freq: Once | ORAL | Status: AC
  Administered 2012-06-14: 30 mL via ORAL
  Filled 2012-06-14: qty 30

## 2012-06-14 NOTE — MAU Note (Signed)
PT SAYS  HER ABD   STARTED HURTING AT 3PM.   SHE STARTED VOMITING  AT 7PM-  SAYS SHE  HAS VOMITED ENTIRE PREG.   SHE TAKES ZOFRAN- LAST AT   1PM.

## 2012-06-14 NOTE — MAU Provider Note (Signed)
I saw and examined patient and agree with above resident note. I reviewed labs, prenatal records, vitals and NST. FHTs are reassuring. Pt hemodynamically stable. Has f/u appt in [place] tomorrow. Napoleon Form, MD

## 2012-06-14 NOTE — MAU Provider Note (Signed)
History     CSN: [id number]  Arrival date and time: 06/14/12 2029   None     No chief complaint on file.  HPI Comments: 32 yo G1P0 @ [redacted]w[redacted]d followed by [facility] clinic presents with abdominal pain and vomiting.  She reports her abdomen started hurting around 3pm and she began vomiting around 7pm. She has vomited for most of the pregnancy.  However, this time, she vomited blood.  She had 2 episodes, very close together.  She is unsure how much blood it was, probably less than a cup.  Bright red blood, no clots.  Last ate some crackers around 2pm and has drank some water and Pepsi today (nothing red colored).  Is having epigastric pain, which is similar to her usual pain.  No fevers/chills, no diarrhea. +FM, no vag bleeding, no LOF. She is not currently nauseated.  She has not noticed any blood in her stool.    OB History    Grav Para Term Preterm Abortions TAB SAB Ect Mult Living   1               History reviewed. No pertinent past medical history.  History reviewed. No pertinent past surgical history.  Family History  Problem Relation Age of Onset  . Hypertension Mother   . Cancer Sister 88    breast  . Diabetes Father     History  Substance Use Topics  . Smoking status: Never Smoker   . Smokeless tobacco: Never Used  . Alcohol Use: No    Allergies:  Allergies  Allergen Reactions  . Latex Itching and Rash    Prescriptions prior to admission  Medication Sig Dispense Refill  . ondansetron (ZOFRAN) 8 MG tablet Take 1 tablet (8 mg total) by mouth every 8 (eight) hours as needed for nausea.  30 tablet  1  . PRENATAL VITAMINS PO Take 1 tablet by mouth daily.         Review of Systems  Constitutional: Negative for fever.  Respiratory: Negative for cough and shortness of breath.   Cardiovascular: Negative for chest pain.  Gastrointestinal: Positive for vomiting and abdominal pain. Negative for nausea, diarrhea, blood in stool and melena.  Genitourinary: Negative for  dysuria.  Musculoskeletal: Negative for falls.  Neurological: Negative for dizziness and headaches.   Physical Exam   Blood pressure 119/75, pulse 81, temperature 97.7 F (36.5 C), temperature source Oral, resp. rate 18, height 5\' 4"  (1.626 m), weight 118.502 kg (261 lb 4 oz), last menstrual period 11/29/2011.  Physical Exam  Constitutional: She is oriented to person, place, and time. She appears well-developed and well-nourished. No distress.  HENT:  Head: Normocephalic and atraumatic.  Eyes: Conjunctivae normal and EOM are normal. Pupils are equal, round, and reactive to light.  Neck: Normal range of motion. Neck supple.  Cardiovascular: Normal rate, regular rhythm, normal heart sounds and intact distal pulses.   No murmur heard. Respiratory: Effort normal and breath sounds normal. No respiratory distress.  GI: Soft. Normal appearance and bowel sounds are normal. She exhibits no distension. There is tenderness in the epigastric area. There is no guarding.       Gravid, obese  Musculoskeletal: Normal range of motion. She exhibits no edema.  Neurological: She is alert and oriented to person, place, and time.  Skin: Skin is warm and dry. No rash noted. She is not diaphoretic.   FHT: 140, mod variability, + accel, no decel Toco: no ctx  MAU Course  Procedures  MDM Will give GI cocktail to see if this improved epigastric pain. Will also check CBC and give PPI.  Feeling much better after GI cocktail, no further emesis  Assessment and Plan  32 yo G1 @ [redacted]w[redacted]d presents with abdominal pain and hematemesis  -Gastritis vs small Mallory-Weiss tear vs gastric ulcer -Hgb stable, symptoms improved -Will start on PPI at home -Has appt at [facility] tomorrow, pt knows to keep this appt, discussed red flags for return to MAU  [facility] 06/14/2012, 9:26 PM

## 2012-06-15 ENCOUNTER — Ambulatory Visit (INDEPENDENT_AMBULATORY_CARE_PROVIDER_SITE_OTHER): Payer: Medicaid Other | Admitting: Advanced Practice Midwife

## 2012-06-15 VITALS — BP 110/66 | Wt 260.0 lb

## 2012-06-15 DIAGNOSIS — Z3480 Encounter for supervision of other normal pregnancy, unspecified trimester: Secondary | ICD-10-CM

## 2012-06-15 DIAGNOSIS — O361190 Maternal care for Anti-A sensitization, unspecified trimester, not applicable or unspecified: Secondary | ICD-10-CM

## 2012-06-15 DIAGNOSIS — Z3400 Encounter for supervision of normal first pregnancy, unspecified trimester: Secondary | ICD-10-CM

## 2012-06-15 DIAGNOSIS — Z3490 Encounter for supervision of normal pregnancy, unspecified, unspecified trimester: Secondary | ICD-10-CM

## 2012-06-15 LAB — CBC
HCT: 31.9 % — ABNORMAL LOW (ref 36.0–46.0)
Hemoglobin: 10.4 g/dL — ABNORMAL LOW (ref 12.0–15.0)
MCHC: 32.6 g/dL (ref 30.0–36.0)
RDW: 14.5 % (ref 11.5–15.5)
WBC: 11.3 10*3/uL — ABNORMAL HIGH (ref 4.0–10.5)

## 2012-06-15 MED ORDER — RHO D IMMUNE GLOBULIN 1500 UNIT/2ML IJ SOLN
300.0000 ug | Freq: Once | INTRAMUSCULAR | Status: AC
  Administered 2012-06-15: 300 ug via INTRAMUSCULAR

## 2012-06-15 NOTE — Progress Notes (Signed)
p-82  28 week labs today and needs Rhophylac today

## 2012-06-15 NOTE — Progress Notes (Signed)
28 week labs and rhophylac done. Seen in MAU yesterday for hematemesis.Started PPH. No further bleeding. Precautions reviewed. Stay on scheduled antiemetics.

## 2012-06-15 NOTE — Patient Instructions (Addendum)
Breastfeeding Deciding to breastfeed is one of the best choices you can make for you and your baby. The information that follows gives a brief overview of the benefits of breastfeeding as well as common topics surrounding breastfeeding. BENEFITS OF BREASTFEEDING For the baby  The first milk (colostrum) helps the baby's digestive system function better.   There are antibodies in the mother's milk that help the baby fight off infections.   The baby has a lower incidence of asthma, allergies, and sudden infant death syndrome (SIDS).   The nutrients in breast milk are better for the baby than infant formulas, and breast milk helps the baby's brain grow better.   Babies who breastfeed have less gas, colic, and constipation.  For the mother  Breastfeeding helps develop a very special bond between the mother and her baby.   Breastfeeding is convenient, always available at the correct temperature, and costs nothing.   Breastfeeding burns calories in the mother and helps her lose weight that was gained during pregnancy.   Breastfeeding makes the uterus contract back down to normal size faster and slows bleeding following delivery.   Breastfeeding mothers have a lower risk of developing breast cancer.  BREASTFEEDING FREQUENCY  A healthy, full-term baby may breastfeed as often as every hour or space his or her feedings to every 3 hours.   Watch your baby for signs of hunger. Nurse your baby if he or she shows signs of hunger. How often you nurse will vary from baby to baby.   Nurse as often as the baby requests, or when you feel the need to reduce the fullness of your breasts.   Awaken the baby if it has been 3 4 hours since the last feeding.   Frequent feeding will help the mother make more milk and will help prevent problems, such as sore nipples and engorgement of the breasts.  BABY'S POSITION AT THE BREAST  Whether lying down or sitting, be sure that the baby's tummy is  facing your tummy.   Support the breast with 4 fingers underneath the breast and the thumb above. Make sure your fingers are well away from the nipple and baby's mouth.   Stroke the baby's lips gently with your finger or nipple.   When the baby's mouth is open wide enough, place all of your nipple and as much of the areola as possible into your baby's mouth.   Pull the baby in close so the tip of the nose and the baby's cheeks touch the breast during the feeding.  FEEDINGS AND SUCTION  The length of each feeding varies from baby to baby and from feeding to feeding.   The baby must suck about 2 3 minutes for your milk to get to him or her. This is called a "let down." For this reason, allow the baby to feed on each breast as long as he or she wants. Your baby will end the feeding when he or she has received the right balance of nutrients.   To break the suction, put your finger into the corner of the baby's mouth and slide it between his or her gums before removing your breast from his or her mouth. This will help prevent sore nipples.  HOW TO TELL WHETHER YOUR BABY IS GETTING ENOUGH BREAST MILK. Wondering whether or not your baby is getting enough milk is a common concern among mothers. You can be assured that your baby is getting enough milk if:   Your baby is actively   sucking and you hear swallowing.   Your baby seems relaxed and satisfied after a feeding.   Your baby nurses at least 8 12 times in a 24 hour time period. Nurse your baby until he or she unlatches or falls asleep at the first breast (at least 10 20 minutes), then offer the second side.   Your baby is wetting 5 6 disposable diapers (6 8 cloth diapers) in a 24 hour period by 5 6 days of age.   Your baby is having at least 3 4 stools every 24 hours for the first 6 weeks. The stool should be soft and yellow.   Your baby should gain 4 7 ounces per week after he or she is 4 days old.   Your breasts feel softer  after nursing.  REDUCING BREAST ENGORGEMENT  In the first week after your baby is born, you may experience signs of breast engorgement. When breasts are engorged, they feel heavy, warm, full, and may be tender to the touch. You can reduce engorgement if you:   Nurse frequently, every 2 3 hours. Mothers who breastfeed early and often have fewer problems with engorgement.   Place light ice packs on your breasts for 10 20 minutes between feedings. This reduces swelling. Wrap the ice packs in a lightweight towel to protect your skin. Bags of frozen vegetables work well for this purpose.   Take a warm shower or apply warm, moist heat to your breast for 5 10 minutes just before each feeding. This increases circulation and helps the milk flow.   Gently massage your breast before and during the feeding. Using your finger tips, massage from the chest wall towards your nipple in a circular motion.   Make sure that the baby empties at least one breast at every feeding before switching sides.   Use a breast pump to empty the breasts if your baby is sleepy or not nursing well. You may also want to pump if you are returning to work oryou feel you are getting engorged.   Avoid bottle feeds, pacifiers, or supplemental feedings of water or juice in place of breastfeeding. Breast milk is all the food your baby needs. It is not necessary for your baby to have water or formula. In fact, to help your breasts make more milk, it is best not to give your baby supplemental feedings during the early weeks.   Be sure the baby is latched on and positioned properly while breastfeeding.   Wear a supportive bra, avoiding underwire styles.   Eat a balanced diet with enough fluids.   Rest often, relax, and take your prenatal vitamins to prevent fatigue, stress, and anemia.  If you follow these suggestions, your engorgement should improve in 24 48 hours. If you are still experiencing difficulty, call your  lactation consultant or caregiver.  CARING FOR YOURSELF Take care of your breasts  Bathe or shower daily.   Avoid using soap on your nipples.   Start feedings on your left breast at one feeding and on your right breast at the next feeding.   You will notice an increase in your milk supply 2 5 days after delivery. You may feel some discomfort from engorgement, which makes your breasts very firm and often tender. Engorgement "peaks" out within 24 48 hours. In the meantime, apply warm moist towels to your breasts for 5 10 minutes before feeding. Gentle massage and expression of some milk before feeding will soften your breasts, making it easier for your   baby to latch on.   Wear a well-fitting nursing bra, and air dry your nipples for a 3 4minutes after each feeding.   Only use cotton bra pads.   Only use pure lanolin on your nipples after nursing. You do not need to wash it off before feeding the baby again. Another option is to express a few drops of breast milk and gently massage it into your nipples.  Take care of yourself  Eat well-balanced meals and nutritious snacks.   Drinking milk, fruit juice, and water to satisfy your thirst (about 8 glasses a day).   Get plenty of rest.  Avoid foods that you notice affect the baby in a bad way.  SEEK MEDICAL CARE IF:   You have difficulty with breastfeeding and need help.   You have a hard, red, sore area on your breast that is accompanied by a fever.   Your baby is too sleepy to eat well or is having trouble sleeping.   Your baby is wetting less than 6 diapers a day, by 5 days of age.   Your baby's skin or white part of his or her eyes is more yellow than it was in the hospital.   You feel depressed.  Document Released: 05/02/2005 Document Revised: 11/01/2011 Document Reviewed: 07/31/2011 [employer] Patient Information 2013 [employer]. Pregnancy - Third Trimester The third trimester of pregnancy (the last 3  months) is a period of the most rapid growth for you and your baby. The baby approaches a length of 20 inches and a weight of 6 to 10 pounds. The baby is adding on fat and getting ready for life outside your body. While inside, babies have periods of sleeping and waking, suck their thumbs, and hiccups. You can often feel small contractions of the uterus. This is false labor. It is also called Braxton-Hicks contractions. This is like a practice for labor. The usual problems in this stage of pregnancy include more difficulty breathing, swelling of the hands and feet from water retention, and having to urinate more often because of the uterus and baby pressing on your bladder.  PRENATAL EXAMS  Blood work may continue to be done during prenatal exams. These tests are done to check on your health and the probable health of your baby. Blood work is used to follow your blood levels (hemoglobin). Anemia (low hemoglobin) is common during pregnancy. Iron and vitamins are given to help prevent this. You may also continue to be checked for diabetes. Some of the past blood tests may be done again.  The size of the uterus is measured during each visit. This makes sure your baby is growing properly according to your pregnancy dates.  Your blood pressure is checked every prenatal visit. This is to make sure you are not getting toxemia.  Your urine is checked every prenatal visit for infection, diabetes and protein.  Your weight is checked at each visit. This is done to make sure gains are happening at the suggested rate and that you and your baby are growing normally.  Sometimes, an ultrasound is performed to confirm the position and the proper growth and development of the baby. This is a test done that bounces harmless sound waves off the baby so your caregiver can more accurately determine due dates.  Discuss the type of pain medication and anesthesia you will have during your labor and delivery.  Discuss the  possibility and anesthesia if a Cesarean Section might be necessary.  Inform your caregiver if   there is any mental or physical violence at home. Sometimes, a specialized non-stress test, contraction stress test and biophysical profile are done to make sure the baby is not having a problem. Checking the amniotic fluid surrounding the baby is called an amniocentesis. The amniotic fluid is removed by sticking a needle into the belly (abdomen). This is sometimes done near the end of pregnancy if an early delivery is required. In this case, it is done to help make sure the baby's lungs are mature enough for the baby to live outside of the womb. If the lungs are not mature and it is unsafe to deliver the baby, an injection of cortisone medication is given to the mother 1 to 2 days before the delivery. This helps the baby's lungs mature and makes it safer to deliver the baby. CHANGES OCCURING IN THE THIRD TRIMESTER OF PREGNANCY Your body goes through many changes during pregnancy. They vary from person to person. Talk to your caregiver about changes you notice and are concerned about.  During the last trimester, you have probably had an increase in your appetite. It is normal to have cravings for certain foods. This varies from person to person and pregnancy to pregnancy.  You may begin to get stretch marks on your hips, abdomen, and breasts. These are normal changes in the body during pregnancy. There are no exercises or medications to take which prevent this change.  Constipation may be treated with a stool softener or adding bulk to your diet. Drinking lots of fluids, fiber in vegetables, fruits, and whole grains are helpful.  Exercising is also helpful. If you have been very active up until your pregnancy, most of these activities can be continued during your pregnancy. If you have been less active, it is helpful to start an exercise program such as walking. Consult your caregiver before starting exercise  programs.  Avoid all smoking, alcohol, un-prescribed drugs, herbs and "street drugs" during your pregnancy. These chemicals affect the formation and growth of the baby. Avoid chemicals throughout the pregnancy to ensure the delivery of a healthy infant.  Backache, varicose veins and hemorrhoids may develop or get worse.  You will tire more easily in the third trimester, which is normal.  The baby's movements may be stronger and more often.  You may become short of breath easily.  Your belly button may stick out.  A yellow discharge may leak from your breasts called colostrum.  You may have a bloody mucus discharge. This usually occurs a few days to a week before labor begins. HOME CARE INSTRUCTIONS   Keep your caregiver's appointments. Follow your caregiver's instructions regarding medication use, exercise, and diet.  During pregnancy, you are providing food for you and your baby. Continue to eat regular, well-balanced meals. Choose foods such as meat, fish, milk and other low fat dairy products, vegetables, fruits, and whole-grain breads and cereals. Your caregiver will tell you of the ideal weight gain.  A physical sexual relationship may be continued throughout pregnancy if there are no other problems such as early (premature) leaking of amniotic fluid from the membranes, vaginal bleeding, or belly (abdominal) pain.  Exercise regularly if there are no restrictions. Check with your caregiver if you are unsure of the safety of your exercises. Greater weight gain will occur in the last 2 trimesters of pregnancy. Exercising helps:  Control your weight.  Get you in shape for labor and delivery.  You lose weight after you deliver.  Rest a lot with legs   elevated, or as needed for leg cramps or low back pain.  Wear a good support or jogging bra for breast tenderness during pregnancy. This may help if worn during sleep. Pads or tissues may be used in the bra if you are leaking  colostrum.  Do not use hot tubs, steam rooms, or saunas.  Wear your seat belt when driving. This protects you and your baby if you are in an accident.  Avoid raw meat, cat litter boxes and soil used by cats. These carry germs that can cause birth defects in the baby.  It is easier to loose urine during pregnancy. Tightening up and strengthening the pelvic muscles will help with this problem. You can practice stopping your urination while you are going to the bathroom. These are the same muscles you need to strengthen. It is also the muscles you would use if you were trying to stop from passing gas. You can practice tightening these muscles up 10 times a set and repeating this about 3 times per day. Once you know what muscles to tighten up, do not perform these exercises during urination. It is more likely to cause an infection by backing up the urine.  Ask for help if you have financial, counseling or nutritional needs during pregnancy. Your caregiver will be able to offer counseling for these needs as well as refer you for other special needs.  Make a list of emergency phone numbers and have them available.  Plan on getting help from family or friends when you go home from the hospital.  Make a trial run to the hospital.  Take prenatal classes with the father to understand, practice and ask questions about the labor and delivery.  Prepare the baby's room/nursery.  Do not travel out of the city unless it is absolutely necessary and with the advice of your caregiver.  Wear only low or no heal shoes to have better balance and prevent falling. MEDICATIONS AND DRUG USE IN PREGNANCY  Take prenatal vitamins as directed. The vitamin should contain 1 milligram of folic acid. Keep all vitamins out of reach of children. Only a couple vitamins or tablets containing iron may be fatal to a baby or young child when ingested.  Avoid use of all medications, including herbs, over-the-counter medications,  not prescribed or suggested by your caregiver. Only take over-the-counter or prescription medicines for pain, discomfort, or fever as directed by your caregiver. Do not use aspirin, ibuprofen (Motrin, Advil, Nuprin) or naproxen (Aleve) unless OK'd by your caregiver.  Let your caregiver also know about herbs you may be using.  Alcohol is related to a number of birth defects. This includes fetal alcohol syndrome. All alcohol, in any form, should be avoided completely. Smoking will cause low birth rate and premature babies.  Street/illegal drugs are very harmful to the baby. They are absolutely forbidden. A baby born to an addicted mother will be addicted at birth. The baby will go through the same withdrawal an adult does. SEEK MEDICAL CARE IF: You have any concerns or worries during your pregnancy. It is better to call with your questions if you feel they cannot wait, rather than worry about them. DECISIONS ABOUT CIRCUMCISION You may or may not know the sex of your baby. If you know your baby is a boy, it may be time to think about circumcision. Circumcision is the removal of the foreskin of the penis. This is the skin that covers the sensitive end of the penis. There is no proven   medical need for this. Often this decision is made on what is popular at the time or based upon religious beliefs and social issues. You can discuss these issues with your caregiver or pediatrician. SEEK IMMEDIATE MEDICAL CARE IF:   An unexplained oral temperature above 102 F (38.9 C) develops, or as your caregiver suggests.  You have leaking of fluid from the vagina (birth canal). If leaking membranes are suspected, take your temperature and tell your caregiver of this when you call.  There is vaginal spotting, bleeding or passing clots. Tell your caregiver of the amount and how many pads are used.  You develop a bad smelling vaginal discharge with a change in the color from clear to white.  You develop vomiting  that lasts more than 24 hours.  You develop chills or fever.  You develop shortness of breath.  You develop burning on urination.  You loose more than 2 pounds of weight or gain more than 2 pounds of weight or as suggested by your caregiver.  You notice sudden swelling of your face, hands, and feet or legs.  You develop belly (abdominal) pain. Round ligament discomfort is a common non-cancerous (benign) cause of abdominal pain in pregnancy. Your caregiver still must evaluate you.  You develop a severe headache that does not go away.  You develop visual problems, blurred or double vision.  If you have not felt your baby move for more than 1 hour. If you think the baby is not moving as much as usual, eat something with sugar in it and lie down on your left side for an hour. The baby should move at least 4 to 5 times per hour. Call right away if your baby moves less than that.  You fall, are in a car accident or any kind of trauma.  There is mental or physical violence at home. Document Released: 04/26/2001 Document Revised: 07/25/2011 Document Reviewed: 10/29/2008 [employer] Patient Information 2013 [employer].  

## 2012-06-16 LAB — RPR

## 2012-06-16 LAB — HIV ANTIBODY (ROUTINE TESTING W REFLEX): HIV: NONREACTIVE

## 2012-06-16 LAB — GLUCOSE TOLERANCE, 1 HOUR (50G) W/O FASTING: Glucose, 1 Hour GTT: 143 mg/dL — ABNORMAL HIGH (ref 70–140)

## 2012-06-17 ENCOUNTER — Encounter: Payer: Self-pay | Admitting: Advanced Practice Midwife

## 2012-06-18 ENCOUNTER — Telehealth: Payer: Self-pay | Admitting: *Deleted

## 2012-06-19 ENCOUNTER — Telehealth: Payer: Self-pay | Admitting: *Deleted

## 2012-06-19 ENCOUNTER — Encounter: Payer: Self-pay | Admitting: *Deleted

## 2012-06-19 NOTE — Telephone Encounter (Signed)
See above comments.

## 2012-06-19 NOTE — Telephone Encounter (Signed)
Letter mailed to home instructing pt to call office to discuss recent 1 hr GTT.  She will need a 3 hr GTT

## 2012-07-06 ENCOUNTER — Ambulatory Visit (INDEPENDENT_AMBULATORY_CARE_PROVIDER_SITE_OTHER): Payer: Medicaid Other | Admitting: Family

## 2012-07-06 VITALS — BP 108/62 | Temp 97.6°F | Wt 271.0 lb

## 2012-07-06 DIAGNOSIS — O99210 Obesity complicating pregnancy, unspecified trimester: Secondary | ICD-10-CM

## 2012-07-06 DIAGNOSIS — O360990 Maternal care for other rhesus isoimmunization, unspecified trimester, not applicable or unspecified: Secondary | ICD-10-CM

## 2012-07-06 DIAGNOSIS — Z3480 Encounter for supervision of other normal pregnancy, unspecified trimester: Secondary | ICD-10-CM

## 2012-07-06 DIAGNOSIS — Z6791 Unspecified blood type, Rh negative: Secondary | ICD-10-CM

## 2012-07-06 NOTE — Progress Notes (Signed)
P - 84 - Pt states has had cough, congestion, head pain for 1 week - No relief with benedryl or robitussin,

## 2012-07-06 NOTE — Progress Notes (Signed)
Reviewed 1 hr results > plans to do 3 hr test on Monday; head congestion, no fever or chills, lungs - CTA, afebrile > continue to treat the symptoms

## 2012-07-09 NOTE — Addendum Note (Signed)
Addended by: Arne Cleveland on: 07/09/2012 08:21 AM   Modules accepted: Orders

## 2012-07-09 NOTE — Addendum Note (Signed)
Addended by: Arne Cleveland on: 07/09/2012 08:23 AM   Modules accepted: Orders

## 2012-07-12 ENCOUNTER — Telehealth: Payer: Self-pay | Admitting: *Deleted

## 2012-07-12 LAB — GLUCOSE TOLERANCE, 3 HOURS
Glucose Tolerance, 2 hour: 171 mg/dL — ABNORMAL HIGH (ref 70–164)
Glucose Tolerance, Fasting: 85 mg/dL (ref 70–104)
Glucose, GTT - 3 Hour: 102 mg/dL (ref 70–144)

## 2012-07-12 NOTE — Telephone Encounter (Signed)
Spoke with pt's husband that she did pass her 3 hr GTT.

## 2012-07-13 ENCOUNTER — Encounter: Payer: Self-pay | Admitting: Family

## 2012-07-23 ENCOUNTER — Ambulatory Visit (INDEPENDENT_AMBULATORY_CARE_PROVIDER_SITE_OTHER): Payer: Medicaid Other | Admitting: Obstetrics and Gynecology

## 2012-07-23 VITALS — BP 122/76 | Wt 285.0 lb

## 2012-07-23 DIAGNOSIS — O3410 Maternal care for benign tumor of corpus uteri, unspecified trimester: Secondary | ICD-10-CM

## 2012-07-23 DIAGNOSIS — IMO0001 Reserved for inherently not codable concepts without codable children: Secondary | ICD-10-CM | POA: Insufficient documentation

## 2012-07-23 DIAGNOSIS — O24419 Gestational diabetes mellitus in pregnancy, unspecified control: Secondary | ICD-10-CM | POA: Insufficient documentation

## 2012-07-23 DIAGNOSIS — D259 Leiomyoma of uterus, unspecified: Secondary | ICD-10-CM

## 2012-07-23 MED ORDER — ONDANSETRON HCL 8 MG PO TABS: 8.0000 mg | ORAL_TABLET | Freq: Three times a day (TID) | ORAL | Status: DC | PRN

## 2012-07-23 NOTE — Progress Notes (Signed)
P - 77 - Pt states she had an urge to push on Monday - 1 week ago

## 2012-07-23 NOTE — Patient Instructions (Signed)
Pregnancy - Third Trimester  The third trimester of pregnancy (the last 3 months) is a period of the most rapid growth for you and your baby. The baby approaches a length of 20 inches and a weight of 6 to 10 pounds. The baby is adding on fat and getting ready for life outside your body. While inside, babies have periods of sleeping and waking, suck their thumbs, and hiccups. You can often feel small contractions of the uterus. This is false labor. It is also called Braxton-Hicks contractions. This is like a practice for labor. The usual problems in this stage of pregnancy include more difficulty breathing, swelling of the hands and feet from water retention, and having to urinate more often because of the uterus and baby pressing on your bladder.   PRENATAL EXAMS  · Blood work may continue to be done during prenatal exams. These tests are done to check on your health and the probable health of your baby. Blood work is used to follow your blood levels (hemoglobin). Anemia (low hemoglobin) is common during pregnancy. Iron and vitamins are given to help prevent this. You may also continue to be checked for diabetes. Some of the past blood tests may be done again.  · The size of the uterus is measured during each visit. This makes sure your baby is growing properly according to your pregnancy dates.  · Your blood pressure is checked every prenatal visit. This is to make sure you are not getting toxemia.  · Your urine is checked every prenatal visit for infection, diabetes and protein.  · Your weight is checked at each visit. This is done to make sure gains are happening at the suggested rate and that you and your baby are growing normally.  · Sometimes, an ultrasound is performed to confirm the position and the proper growth and development of the baby. This is a test done that bounces harmless sound waves off the baby so your caregiver can more accurately determine due dates.  · Discuss the type of pain medication and  anesthesia you will have during your labor and delivery.  · Discuss the possibility and anesthesia if a Cesarean Section might be necessary.  · Inform your caregiver if there is any mental or physical violence at home.  Sometimes, a specialized non-stress test, contraction stress test and biophysical profile are done to make sure the baby is not having a problem. Checking the amniotic fluid surrounding the baby is called an amniocentesis. The amniotic fluid is removed by sticking a needle into the belly (abdomen). This is sometimes done near the end of pregnancy if an early delivery is required. In this case, it is done to help make sure the baby's lungs are mature enough for the baby to live outside of the womb. If the lungs are not mature and it is unsafe to deliver the baby, an injection of cortisone medication is given to the mother 1 to 2 days before the delivery. This helps the baby's lungs mature and makes it safer to deliver the baby.  CHANGES OCCURING IN THE THIRD TRIMESTER OF PREGNANCY  Your body goes through many changes during pregnancy. They vary from person to person. Talk to your caregiver about changes you notice and are concerned about.  · During the last trimester, you have probably had an increase in your appetite. It is normal to have cravings for certain foods. This varies from person to person and pregnancy to pregnancy.  · You may begin to   get stretch marks on your hips, abdomen, and breasts. These are normal changes in the body during pregnancy. There are no exercises or medications to take which prevent this change.  · Constipation may be treated with a stool softener or adding bulk to your diet. Drinking lots of fluids, fiber in vegetables, fruits, and whole grains are helpful.  · Exercising is also helpful. If you have been very active up until your pregnancy, most of these activities can be continued during your pregnancy. If you have been less active, it is helpful to start an exercise  program such as walking. Consult your caregiver before starting exercise programs.  · Avoid all smoking, alcohol, un-prescribed drugs, herbs and "street drugs" during your pregnancy. These chemicals affect the formation and growth of the baby. Avoid chemicals throughout the pregnancy to ensure the delivery of a healthy infant.  · Backache, varicose veins and hemorrhoids may develop or get worse.  · You will tire more easily in the third trimester, which is normal.  · The baby's movements may be stronger and more often.  · You may become short of breath easily.  · Your belly button may stick out.  · A yellow discharge may leak from your breasts called colostrum.  · You may have a bloody mucus discharge. This usually occurs a few days to a week before labor begins.  HOME CARE INSTRUCTIONS   · Keep your caregiver's appointments. Follow your caregiver's instructions regarding medication use, exercise, and diet.  · During pregnancy, you are providing food for you and your baby. Continue to eat regular, well-balanced meals. Choose foods such as meat, fish, milk and other low fat dairy products, vegetables, fruits, and whole-grain breads and cereals. Your caregiver will tell you of the ideal weight gain.  · A physical sexual relationship may be continued throughout pregnancy if there are no other problems such as early (premature) leaking of amniotic fluid from the membranes, vaginal bleeding, or belly (abdominal) pain.  · Exercise regularly if there are no restrictions. Check with your caregiver if you are unsure of the safety of your exercises. Greater weight gain will occur in the last 2 trimesters of pregnancy. Exercising helps:  · Control your weight.  · Get you in shape for labor and delivery.  · You lose weight after you deliver.  · Rest a lot with legs elevated, or as needed for leg cramps or low back pain.  · Wear a good support or jogging bra for breast tenderness during pregnancy. This may help if worn during  sleep. Pads or tissues may be used in the bra if you are leaking colostrum.  · Do not use hot tubs, steam rooms, or saunas.  · Wear your seat belt when driving. This protects you and your baby if you are in an accident.  · Avoid raw meat, cat litter boxes and soil used by cats. These carry germs that can cause birth defects in the baby.  · It is easier to loose urine during pregnancy. Tightening up and strengthening the pelvic muscles will help with this problem. You can practice stopping your urination while you are going to the bathroom. These are the same muscles you need to strengthen. It is also the muscles you would use if you were trying to stop from passing gas. You can practice tightening these muscles up 10 times a set and repeating this about 3 times per day. Once you know what muscles to tighten up, do not perform these   exercises during urination. It is more likely to cause an infection by backing up the urine.  · Ask for help if you have financial, counseling or nutritional needs during pregnancy. Your caregiver will be able to offer counseling for these needs as well as refer you for other special needs.  · Make a list of emergency phone numbers and have them available.  · Plan on getting help from family or friends when you go home from the hospital.  · Make a trial run to the hospital.  · Take prenatal classes with the father to understand, practice and ask questions about the labor and delivery.  · Prepare the baby's room/nursery.  · Do not travel out of the city unless it is absolutely necessary and with the advice of your caregiver.  · Wear only low or no heal shoes to have better balance and prevent falling.  MEDICATIONS AND DRUG USE IN PREGNANCY  · Take prenatal vitamins as directed. The vitamin should contain 1 milligram of folic acid. Keep all vitamins out of reach of children. Only a couple vitamins or tablets containing iron may be fatal to a baby or young child when ingested.  · Avoid use  of all medications, including herbs, over-the-counter medications, not prescribed or suggested by your caregiver. Only take over-the-counter or prescription medicines for pain, discomfort, or fever as directed by your caregiver. Do not use aspirin, ibuprofen (Motrin®, Advil®, Nuprin®) or naproxen (Aleve®) unless OK'd by your caregiver.  · Let your caregiver also know about herbs you may be using.  · Alcohol is related to a number of birth defects. This includes fetal alcohol syndrome. All alcohol, in any form, should be avoided completely. Smoking will cause low birth rate and premature babies.  · Street/illegal drugs are very harmful to the baby. They are absolutely forbidden. A baby born to an addicted mother will be addicted at birth. The baby will go through the same withdrawal an adult does.  SEEK MEDICAL CARE IF:  You have any concerns or worries during your pregnancy. It is better to call with your questions if you feel they cannot wait, rather than worry about them.  DECISIONS ABOUT CIRCUMCISION  You may or may not know the sex of your baby. If you know your baby is a boy, it may be time to think about circumcision. Circumcision is the removal of the foreskin of the penis. This is the skin that covers the sensitive end of the penis. There is no proven medical need for this. Often this decision is made on what is popular at the time or based upon religious beliefs and social issues. You can discuss these issues with your caregiver or pediatrician.  SEEK IMMEDIATE MEDICAL CARE IF:   · An unexplained oral temperature above 102° F (38.9° C) develops, or as your caregiver suggests.  · You have leaking of fluid from the vagina (birth canal). If leaking membranes are suspected, take your temperature and tell your caregiver of this when you call.  · There is vaginal spotting, bleeding or passing clots. Tell your caregiver of the amount and how many pads are used.  · You develop a bad smelling vaginal discharge with  a change in the color from clear to white.  · You develop vomiting that lasts more than 24 hours.  · You develop chills or fever.  · You develop shortness of breath.  · You develop burning on urination.  · You loose more than 2 pounds of weight   or gain more than 2 pounds of weight or as suggested by your caregiver.  · You notice sudden swelling of your face, hands, and feet or legs.  · You develop belly (abdominal) pain. Round ligament discomfort is a common non-cancerous (benign) cause of abdominal pain in pregnancy. Your caregiver still must evaluate you.  · You develop a severe headache that does not go away.  · You develop visual problems, blurred or double vision.  · If you have not felt your baby move for more than 1 hour. If you think the baby is not moving as much as usual, eat something with sugar in it and lie down on your left side for an hour. The baby should move at least 4 to 5 times per hour. Call right away if your baby moves less than that.  · You fall, are in a car accident or any kind of trauma.  · There is mental or physical violence at home.  Document Released: 04/26/2001 Document Revised: 07/25/2011 Document Reviewed: 10/29/2008  [employer]® Patient Information ©2013 [employer].

## 2012-07-23 NOTE — Progress Notes (Signed)
Doing well. Watch S:D. (has fibroids so doubt LGA) Will check Fasting CBG in 1 wk. Since had an abnl value on the 3hr OGTT. Refilled Zofran. Denies upper abd pain or UCs.

## 2012-07-30 ENCOUNTER — Other Ambulatory Visit (INDEPENDENT_AMBULATORY_CARE_PROVIDER_SITE_OTHER): Payer: Medicaid Other

## 2012-07-30 DIAGNOSIS — O99810 Abnormal glucose complicating pregnancy: Secondary | ICD-10-CM

## 2012-08-02 ENCOUNTER — Inpatient Hospital Stay (HOSPITAL_COMMUNITY)
Admission: AD | Admit: 2012-08-02 | Discharge: 2012-08-02 | Disposition: A | Discharge status: Home / Self Care | Payer: Medicaid Other | Source: Ambulatory Visit | Attending: Obstetrics & Gynecology | Admitting: Obstetrics & Gynecology

## 2012-08-02 ENCOUNTER — Encounter (HOSPITAL_COMMUNITY): Payer: Self-pay | Admitting: General Practice

## 2012-08-02 ENCOUNTER — Inpatient Hospital Stay (HOSPITAL_COMMUNITY): Payer: Medicaid Other

## 2012-08-02 DIAGNOSIS — O4100X0 Oligohydramnios, unspecified trimester, not applicable or unspecified: Secondary | ICD-10-CM

## 2012-08-02 DIAGNOSIS — O4103X1 Oligohydramnios, third trimester, fetus 1: Secondary | ICD-10-CM

## 2012-08-02 DIAGNOSIS — O4700 False labor before 37 completed weeks of gestation, unspecified trimester: Secondary | ICD-10-CM | POA: Insufficient documentation

## 2012-08-02 HISTORY — DX: Unspecified blood type, rh negative: Z67.91

## 2012-08-02 HISTORY — DX: Mild hyperemesis gravidarum: O21.0

## 2012-08-02 LAB — URINALYSIS, ROUTINE W REFLEX MICROSCOPIC
Bilirubin Urine: NEGATIVE
Glucose, UA: NEGATIVE mg/dL
Hgb urine dipstick: NEGATIVE
Specific Gravity, Urine: >1.03 — ABNORMAL HIGH (ref 1.005–1.030)
Urobilinogen, UA: 1 mg/dL (ref 0.0–1.0)

## 2012-08-02 MED ORDER — OXYCODONE HCL 5 MG PO TABS
5.0000 mg | ORAL_TABLET | Freq: Once | ORAL | Status: AC
  Administered 2012-08-02: 5 mg via ORAL
  Filled 2012-08-02: qty 1

## 2012-08-02 NOTE — MAU Provider Note (Signed)
History     CSN: [id number]  Arrival date and time: 08/02/12 4401     Chief Complaint  Patient presents with  . Labor Eval   HPI  Pt is a 32 y/o G1P0 GA [redacted]w[redacted]d (dating from LMP) who presents today for questionable onset of labor. She reports that she began having contractions this morning at about 4:30 am and they have gotten progressively closer together. She has not been timing them. She denies any bleeding or gush of fluids She reports no complications during the pregnancy other than morning sickness throughout the entire pregnancy and acid reflux. She has been experiencing LE edema for about the past three weeks. She denies any headache, upper abdominal pain, or changes in her vision. She is feeling the baby move as normal. She had a 3-hr GTT with one abnormal value.  Past Medical History  Diagnosis Date  . Blood type, Rh negative   . Hyperemesis arising during pregnancy     Past Surgical History  Procedure Laterality Date  . No past surgeries      Family History  Problem Relation Age of Onset  . Hypertension Mother   . Cancer Sister 49    breast  . Diabetes Father     History  Substance Use Topics  . Smoking status: Never Smoker   . Smokeless tobacco: Never Used  . Alcohol Use: No    Allergies:  Allergies  Allergen Reactions  . Latex Itching and Rash    Prescriptions prior to admission  Medication Sig Dispense Refill  . ondansetron (ZOFRAN) 8 MG tablet Take 8 mg by mouth daily as needed for nausea.      . pantoprazole (PROTONIX) 40 MG tablet Take 1 tablet (40 mg total) by mouth daily.  30 tablet  5  . PRENATAL VITAMINS PO Take 1 tablet by mouth daily.       . [DISCONTINUED] ondansetron (ZOFRAN) 8 MG tablet Take 1 tablet (8 mg total) by mouth every 8 (eight) hours as needed for nausea.  30 tablet  1   Results for orders placed during the hospital encounter of 08/02/12 (from the past 24 hour(s))  URINALYSIS, ROUTINE W REFLEX MICROSCOPIC     Status: Abnormal    Collection Time    08/02/12 11:13 AM      Result Value Range   Color, Urine YELLOW  YELLOW   APPearance CLEAR  CLEAR   Specific Gravity, Urine >1.030 (*) 1.005 - 1.030   pH 6.0  5.0 - 8.0   Glucose, UA NEGATIVE  NEGATIVE mg/dL   Hgb urine dipstick NEGATIVE  NEGATIVE   Bilirubin Urine NEGATIVE  NEGATIVE   Ketones, ur NEGATIVE  NEGATIVE mg/dL   Protein, ur NEGATIVE  NEGATIVE mg/dL   Urobilinogen, UA 1.0  0.0 - 1.0 mg/dL   Nitrite NEGATIVE  NEGATIVE   Leukocytes, UA NEGATIVE  NEGATIVE     Review of Systems  A 14-point review of systems was asked and positive pertinent and negatives are discussed in the HPI.  Physical Exam   Blood pressure 130/72, pulse 95, temperature 97.4 F (36.3 C), temperature source Oral, height 5\' 4"  (1.626 m), last menstrual period 11/29/2011, SpO2 100.00%.  Physical Exam  Constitutional: She is oriented to person, place, and time. She appears well-developed and well-nourished. She appears distressed (accompanying contractions).  Cardiovascular: Normal rate, regular rhythm and normal heart sounds.   Respiratory: Effort normal and breath sounds normal. No respiratory distress.  Musculoskeletal: She exhibits edema (2+ pitting edema  bilaterally in lower extremities).  Neurological: She is alert and oriented to person, place, and time. She has normal reflexes.  Psychiatric: She has a normal mood and affect. Her behavior is normal. Judgment and thought content normal.   Dilation: Closed Exam by:: Dr. Thad Ranger  FHT: baseline 120, moderate variability, accels present, no decels,  Toco: irreg q1-6 min  MAU Course  Procedures  Results for orders placed during the hospital encounter of 08/02/12 (from the past 24 hour(s))  URINALYSIS, ROUTINE W REFLEX MICROSCOPIC     Status: Abnormal   Collection Time    08/02/12 11:13 AM      Result Value Range   Color, Urine YELLOW  YELLOW   APPearance CLEAR  CLEAR   Specific Gravity, Urine >1.030 (*) 1.005 - 1.030   pH  6.0  5.0 - 8.0   Glucose, UA NEGATIVE  NEGATIVE mg/dL   Hgb urine dipstick NEGATIVE  NEGATIVE   Bilirubin Urine NEGATIVE  NEGATIVE   Ketones, ur NEGATIVE  NEGATIVE mg/dL   Protein, ur NEGATIVE  NEGATIVE mg/dL   Urobilinogen, UA 1.0  0.0 - 1.0 mg/dL   Nitrite NEGATIVE  NEGATIVE   Leukocytes, UA NEGATIVE  NEGATIVE   US Ob Limited  08/02/2012  OBSTETRICAL ULTRASOUND: This exam was performed within a [facility]. The OB US report was generated in the [facility] system, and faxed to the ordering physician.   This report is also available in [employer] and in the [employer]. See [facility] Obstetric US report.    Assessment and Plan  32 y/o G1P0 at [redacted]w[redacted]d presenting with contractions - Cervix closed at 0800, repeat exam 2.5 hours later she is still closed - Ultrasound for amniotic fluid showing low AFI at 8th percentile, Needs repeat US in 1 week - U/A with elevated specific gravity, encouraged PO fluids with patient - Signs of labor and kick counts reviewed, F/u at [facility] in 1 week, return if symptoms worsen  Kevin Fenton 08/02/2012, 11:59 AM  Evaluation and management procedures were performed by Resident physician under my supervision/collaboration. Chart reviewed, patient examined by me and I agree with management and plan. I did cervix exam and scheduled [place] in 1 week to F/U low AFI.

## 2012-08-02 NOTE — MAU Note (Signed)
Pt brought from lobby [facility] to room 8. Unable to void at this time.

## 2012-08-02 NOTE — MAU Note (Signed)
Pt stated contractions started at 0430 A.M.Contractions was 10 mins apart when nit started but more closer together now.No bleeding or leaking.

## 2012-08-10 ENCOUNTER — Encounter (HOSPITAL_COMMUNITY): Payer: Self-pay | Admitting: *Deleted

## 2012-08-10 ENCOUNTER — Inpatient Hospital Stay (HOSPITAL_COMMUNITY)
Admission: AD | Admit: 2012-08-10 | Discharge: 2012-08-10 | Disposition: A | Discharge status: Home / Self Care | Payer: Medicaid Other | Source: Ambulatory Visit | Attending: Obstetrics & Gynecology | Admitting: Obstetrics & Gynecology

## 2012-08-10 ENCOUNTER — Ambulatory Visit (INDEPENDENT_AMBULATORY_CARE_PROVIDER_SITE_OTHER): Payer: Medicaid Other | Admitting: Family

## 2012-08-10 VITALS — BP 132/80 | Wt 299.0 lb

## 2012-08-10 DIAGNOSIS — O163 Unspecified maternal hypertension, third trimester: Secondary | ICD-10-CM

## 2012-08-10 DIAGNOSIS — Z3400 Encounter for supervision of normal first pregnancy, unspecified trimester: Secondary | ICD-10-CM

## 2012-08-10 DIAGNOSIS — O139 Gestational [pregnancy-induced] hypertension without significant proteinuria, unspecified trimester: Secondary | ICD-10-CM

## 2012-08-10 DIAGNOSIS — Z3009 Encounter for other general counseling and advice on contraception: Secondary | ICD-10-CM

## 2012-08-10 DIAGNOSIS — R6889 Other general symptoms and signs: Secondary | ICD-10-CM

## 2012-08-10 DIAGNOSIS — O133 Gestational [pregnancy-induced] hypertension without significant proteinuria, third trimester: Secondary | ICD-10-CM

## 2012-08-10 DIAGNOSIS — Z3403 Encounter for supervision of normal first pregnancy, third trimester: Secondary | ICD-10-CM

## 2012-08-10 DIAGNOSIS — O1203 Gestational edema, third trimester: Secondary | ICD-10-CM

## 2012-08-10 DIAGNOSIS — IMO0002 Reserved for concepts with insufficient information to code with codable children: Secondary | ICD-10-CM

## 2012-08-10 LAB — CBC
HCT: 29 % — ABNORMAL LOW (ref 36.0–46.0)
Hemoglobin: 9.7 g/dL — ABNORMAL LOW (ref 12.0–15.0)
MCH: 26.4 pg (ref 26.0–34.0)
MCHC: 33.4 g/dL (ref 30.0–36.0)
MCV: 79 fL (ref 78.0–100.0)
Platelets: 287 10*3/uL (ref 150–400)
RBC: 3.67 MIL/uL — ABNORMAL LOW (ref 3.87–5.11)
RDW: 14.3 % (ref 11.5–15.5)
WBC: 10.1 10*3/uL (ref 4.0–10.5)

## 2012-08-10 LAB — COMPREHENSIVE METABOLIC PANEL
ALT: 6 U/L (ref 0–35)
AST: 8 U/L (ref 0–37)
Albumin: 2 g/dL — ABNORMAL LOW (ref 3.5–5.2)
Alkaline Phosphatase: 181 U/L — ABNORMAL HIGH (ref 39–117)
BUN: 4 mg/dL — ABNORMAL LOW (ref 6–23)
CO2: 21 mEq/L (ref 19–32)
Calcium: 8.2 mg/dL — ABNORMAL LOW (ref 8.4–10.5)
Chloride: 107 mEq/L (ref 96–112)
Creatinine, Ser: 0.58 mg/dL (ref 0.50–1.10)
GFR calc Af Amer: >90 mL/min (ref >90)
GFR calc non Af Amer: >90 mL/min (ref >90)
Glucose, Bld: 119 mg/dL — ABNORMAL HIGH (ref 70–99)
Potassium: 3.4 mEq/L — ABNORMAL LOW (ref 3.5–5.1)
Sodium: 138 mEq/L (ref 135–145)
Total Bilirubin: 0.2 mg/dL — ABNORMAL LOW (ref 0.3–1.2)
Total Protein: 5.3 g/dL — ABNORMAL LOW (ref 6.0–8.3)

## 2012-08-10 LAB — OB RESULTS CONSOLE GC/CHLAMYDIA
Chlamydia: NEGATIVE
Gonorrhea: NEGATIVE

## 2012-08-10 LAB — PROTEIN / CREATININE RATIO, URINE
Creatinine, Urine: 148.08 mg/dL
Total Protein, Urine: 29.3 mg/dL

## 2012-08-10 NOTE — Progress Notes (Signed)
Pt denies headache, vision changes.  Increased swelling.  Due to change in baseline blood pressure/proteinuria > send to MAU for prex evaluation.

## 2012-08-10 NOTE — Progress Notes (Signed)
p=101 

## 2012-08-10 NOTE — MAU Note (Signed)
Sent from clinic for further eval.  Increased swelling- on going problem.  Slight BP elevation.  Denies HA, epigastric pain or visual changes.

## 2012-08-10 NOTE — MAU Provider Note (Signed)
History     CSN: [id number]  Arrival date and time: 08/10/12 1343   First Provider Initiated Contact with Patient 08/10/12 1502      Chief Complaint  Patient presents with  . PIH eval    HPI This is a 32 y.o. female at [redacted]w[redacted]d who presents from clinic for preeclampsia evaluation. Has had some increase in her edema and BPs.  Denies neuro symptoms.   RN Note: Sent from clinic for further eval. Increased swelling- on going problem. Slight BP elevation. Denies HA, epigastric pain or visual changes  OB History   Grav Para Term Preterm Abortions TAB SAB Ect Mult Living   1               Past Medical History  Diagnosis Date  . Blood type, Rh negative   . Hyperemesis arising during pregnancy     Past Surgical History  Procedure Laterality Date  . No past surgeries      Family History  Problem Relation Age of Onset  . Hypertension Mother   . Cancer Sister 48    breast  . Diabetes Father     History  Substance Use Topics  . Smoking status: Never Smoker   . Smokeless tobacco: Never Used  . Alcohol Use: No    Allergies:  Allergies  Allergen Reactions  . Latex Itching and Rash    Prescriptions prior to admission  Medication Sig Dispense Refill  . ondansetron (ZOFRAN) 8 MG tablet Take 8 mg by mouth daily as needed for nausea.      . pantoprazole (PROTONIX) 40 MG tablet Take 1 tablet (40 mg total) by mouth daily.  30 tablet  5  . PRENATAL VITAMINS PO Take 1 tablet by mouth daily.         Review of Systems  Constitutional: Negative for fever and chills.  Eyes: Negative for blurred vision.  Gastrointestinal: Negative for heartburn and abdominal pain.  Musculoskeletal: Negative for myalgias.  Neurological: Negative for dizziness and headaches.   Physical Exam   Blood pressure 126/71, pulse 90, temperature 98.1 F (36.7 C), temperature source Oral, resp. rate 20, last menstrual period 11/29/2011.  Filed Vitals:   08/10/12 1423 08/10/12 1431 08/10/12 1446  08/10/12 1501  BP: 137/87 133/71 135/73 135/72  Pulse: 94 85 88 87  Temp:      TempSrc:      Resp:        Physical Exam  Constitutional: She is oriented to person, place, and time. She appears well-developed and well-nourished. No distress.  HENT:  Head: Normocephalic.  Cardiovascular: Normal rate.   Respiratory: Effort normal.  GI: Soft.  Musculoskeletal: Normal range of motion. She exhibits edema.  Neurological: She is alert and oriented to person, place, and time. She has normal reflexes.  Skin: Skin is warm and dry.  Psychiatric: She has a normal mood and affect.    MAU Course  Procedures  MDM Results for orders placed during the hospital encounter of 08/10/12 (from the past 24 hour(s))  PROTEIN / CREATININE RATIO, URINE     Status: Abnormal   Collection Time    08/10/12  2:00 PM      Result Value Range   Creatinine, Urine 148.08     Total Protein, Urine 29.3     PROTEIN CREATININE RATIO 0.20 (*) 0.00 - 0.15  CBC     Status: Abnormal   Collection Time    08/10/12  2:50 PM      Result  Value Range   WBC 10.1  4.0 - 10.5 K/uL   RBC 3.67 (*) 3.87 - 5.11 MIL/uL   Hemoglobin 9.7 (*) 12.0 - 15.0 g/dL   HCT 16.1 (*) 09.6 - 04.5 %   MCV 79.0  78.0 - 100.0 fL   MCH 26.4  26.0 - 34.0 pg   MCHC 33.4  30.0 - 36.0 g/dL   RDW 40.9  81.1 - 91.4 %   Platelets 287  150 - 400 K/uL  COMPREHENSIVE METABOLIC PANEL     Status: Abnormal   Collection Time    08/10/12  2:50 PM      Result Value Range   Sodium 138  135 - 145 mEq/L   Potassium 3.4 (*) 3.5 - 5.1 mEq/L   Chloride 107  96 - 112 mEq/L   CO2 21  19 - 32 mEq/L   Glucose, Bld 119 (*) 70 - 99 mg/dL   BUN 4 (*) 6 - 23 mg/dL   Creatinine, Ser 7.82  0.50 - 1.10 mg/dL   Calcium 8.2 (*) 8.4 - 10.5 mg/dL   Total Protein 5.3 (*) 6.0 - 8.3 g/dL   Albumin 2.0 (*) 3.5 - 5.2 g/dL   AST 8  0 - 37 U/L   ALT 6  0 - 35 U/L   Alkaline Phosphatase 181 (*) 39 - 117 U/L   Total Bilirubin 0.2 (*) 0.3 - 1.2 mg/dL   GFR calc non Af Amer  >90  >90 mL/min   GFR calc Af Amer >90  >90 mL/min   NST reactive  Assessment and Plan  A:  [facility] at [redacted]w[redacted]d       Gestational edema       Gestational hypertension, labile      No signs of preeclampsia  P:  Discharge home       Preeclampsia precautions   Travontae Freiberger 08/10/2012, 3:04 PM

## 2012-08-11 LAB — GC/CHLAMYDIA PROBE AMP: GC Probe RNA: NEGATIVE

## 2012-08-13 ENCOUNTER — Observation Stay (HOSPITAL_COMMUNITY)
Admission: AD | Admit: 2012-08-13 | Discharge: 2012-08-14 | Disposition: A | Discharge status: Home / Self Care | Payer: Medicaid Other | Source: Ambulatory Visit | Attending: Obstetrics & Gynecology | Admitting: Obstetrics & Gynecology

## 2012-08-13 ENCOUNTER — Encounter (HOSPITAL_COMMUNITY): Payer: Self-pay | Admitting: *Deleted

## 2012-08-13 DIAGNOSIS — Z3009 Encounter for other general counseling and advice on contraception: Secondary | ICD-10-CM

## 2012-08-13 DIAGNOSIS — R109 Unspecified abdominal pain: Secondary | ICD-10-CM | POA: Insufficient documentation

## 2012-08-13 DIAGNOSIS — R51 Headache: Secondary | ICD-10-CM | POA: Insufficient documentation

## 2012-08-13 DIAGNOSIS — O26839 Pregnancy related renal disease, unspecified trimester: Secondary | ICD-10-CM | POA: Insufficient documentation

## 2012-08-13 DIAGNOSIS — O479 False labor, unspecified: Secondary | ICD-10-CM | POA: Insufficient documentation

## 2012-08-13 DIAGNOSIS — IMO0002 Reserved for concepts with insufficient information to code with codable children: Principal | ICD-10-CM

## 2012-08-13 DIAGNOSIS — O1223 Gestational edema with proteinuria, third trimester: Secondary | ICD-10-CM

## 2012-08-13 HISTORY — DX: Benign neoplasm of connective and other soft tissue, unspecified: D21.9

## 2012-08-13 HISTORY — DX: Obesity, unspecified: E66.9

## 2012-08-13 LAB — CULTURE, BETA STREP (GROUP B ONLY)

## 2012-08-13 LAB — CBC
HCT: 31.1 % — ABNORMAL LOW (ref 36.0–46.0)
Hemoglobin: 10.2 g/dL — ABNORMAL LOW (ref 12.0–15.0)
MCHC: 32.8 g/dL (ref 30.0–36.0)
MCV: 78.9 fL (ref 78.0–100.0)
RDW: 14.3 % (ref 11.5–15.5)

## 2012-08-13 LAB — COMPREHENSIVE METABOLIC PANEL
Alkaline Phosphatase: 254 U/L — ABNORMAL HIGH (ref 39–117)
BUN: 6 mg/dL (ref 6–23)
Creatinine, Ser: 0.61 mg/dL (ref 0.50–1.10)
GFR calc Af Amer: >90 mL/min (ref >90)
Glucose, Bld: 100 mg/dL — ABNORMAL HIGH (ref 70–99)
Potassium: 3.4 mEq/L — ABNORMAL LOW (ref 3.5–5.1)
Total Bilirubin: 0.3 mg/dL (ref 0.3–1.2)
Total Protein: 5.8 g/dL — ABNORMAL LOW (ref 6.0–8.3)

## 2012-08-13 LAB — PROTEIN / CREATININE RATIO, URINE
Creatinine, Urine: 166.74 mg/dL
Total Protein, Urine: 43.3 mg/dL

## 2012-08-13 MED ORDER — DOCUSATE SODIUM 100 MG PO CAPS
100.0000 mg | ORAL_CAPSULE | Freq: Every day | ORAL | Status: DC
  Administered 2012-08-14: 100 mg via ORAL
  Filled 2012-08-13: qty 1

## 2012-08-13 MED ORDER — ACETAMINOPHEN 325 MG PO TABS: 650.0000 mg | ORAL_TABLET | ORAL | Status: DC | PRN

## 2012-08-13 MED ORDER — TRAMADOL HCL 50 MG PO TABS
50.0000 mg | ORAL_TABLET | Freq: Once | ORAL | Status: AC
  Administered 2012-08-13: 50 mg via ORAL
  Filled 2012-08-13: qty 1

## 2012-08-13 MED ORDER — PRENATAL MULTIVITAMIN CH: 1.0000 | ORAL_TABLET | Freq: Every day | ORAL | Status: DC

## 2012-08-13 MED ORDER — CALCIUM CARBONATE ANTACID 500 MG PO CHEW: 2.0000 | CHEWABLE_TABLET | ORAL | Status: DC | PRN

## 2012-08-13 MED ORDER — ZOLPIDEM TARTRATE 5 MG PO TABS: 5.0000 mg | ORAL_TABLET | Freq: Every evening | ORAL | Status: DC | PRN

## 2012-08-13 NOTE — MAU Note (Signed)
Sharp lower abd pain, tender to touch, 3+ pitting edema in feet, hurts to walk, HA.  In MAU on Friday for PIH eval.  MD's office advised pt to come to MAU.  Pt denies LOF or bleeding.

## 2012-08-13 NOTE — MAU Provider Note (Signed)
History     CSN: [id number]  Arrival date and time: 08/13/12 1654   First Provider Initiated Contact with Patient 08/13/12 1839      Chief Complaint  Patient presents with  . Abdominal Pain  . Edema   HPI 32 y/o G1P0 at 36.6 with abd pain, headache, and worsening edema. She has a pounding, 8/10, frontal headache that began this morning. It is not worsened by light, sound, scents and is not accompanied by increased nasuea. She began having black squiggly scotoma today which she has never had. She was recently seen i n the MAU a few days ago for pre-eclampsia. Her edema has been progressively worsening. She notes that her hands and face also get swollen adn she gets transient numbness in her hands. She has had decreased fetal movent throughout today. She denies vaginal LOF/bleeding/discahrge/irritation. She has been having contractions for about 1 week which have been progressivley worsening and are now an 8/10. She has Lower abdominal sharp pain radiates to her groin that worsens with standing or walking. She has had sharp RUQ pain that comes and goes she is attributing to her contractions.   OB History   Grav Para Term Preterm Abortions TAB SAB Ect Mult Living   1         0      Past Medical History  Diagnosis Date  . Blood type, Rh negative   . Hyperemesis arising during pregnancy   . Obesity   . Fibroid     Past Surgical History  Procedure Laterality Date  . No past surgeries      Family History  Problem Relation Age of Onset  . Hypertension Mother   . Cancer Sister 72    breast  . Diabetes Father     History  Substance Use Topics  . Smoking status: Never Smoker   . Smokeless tobacco: Never Used  . Alcohol Use: No    Allergies:  Allergies  Allergen Reactions  . Latex Itching and Rash    Prescriptions prior to admission  Medication Sig Dispense Refill  . ondansetron (ZOFRAN) 8 MG tablet Take 8 mg by mouth daily as needed for nausea.      . pantoprazole  (PROTONIX) 40 MG tablet Take 1 tablet (40 mg total) by mouth daily.  30 tablet  5  . Prenatal Vit-Fe Fumarate-FA (PRENATAL MULTIVITAMIN) TABS Take 1 tablet by mouth at bedtime.        ROS Per HPI Physical Exam   Blood pressure 118/74, pulse 97, temperature 98 F (36.7 C), temperature source Oral, resp. rate 20, height 5\' 4"  (1.626 m), weight 137.168 kg (302 lb 6.4 oz), last menstrual period 11/29/2011, SpO2 99.00%. Filed Vitals:   08/13/12 1832 08/13/12 1842 08/13/12 1852 08/13/12 1902  BP: 127/77 131/77 129/75 126/69  Pulse: 91 83 89 89  Temp:      TempSrc:      Resp:      Height:      Weight:      SpO2:         Physical Exam Gen: NAD, alert, cooperative with exam HEENT: NCAT CV: RRR, good S1/S2, no murmur Resp: CTABL, no wheezes, non-labored Abd: Soft, non tender pregnant abdomen Ext: 2-3+ edema on BL LE Neuro: Alert and oriented, No gross deficits, 2+ DTRs, 1 beat of clonus on BL LE.  SVE: Cervix fingertip 60 % effaced, vertex   FHT: Reactive Toco: irregular contractions every 3-4 minutes  Dilation: 1 Effacement (%): 50  Cervical Position: Posterior Station: -3 Presentation: Undeterminable Exam by:: Laurell Josephs RN  MAU Course  Procedures  Results for orders placed during the hospital encounter of 08/13/12 (from the past 24 hour(s))  CBC     Status: Abnormal   Collection Time    08/13/12  5:10 PM      Result Value Range   WBC 10.0  4.0 - 10.5 K/uL   RBC 3.94  3.87 - 5.11 MIL/uL   Hemoglobin 10.2 (*) 12.0 - 15.0 g/dL   HCT 11.9 (*) 14.7 - 82.9 %   MCV 78.9  78.0 - 100.0 fL   MCH 25.9 (*) 26.0 - 34.0 pg   MCHC 32.8  30.0 - 36.0 g/dL   RDW 56.2  13.0 - 86.5 %   Platelets 304  150 - 400 K/uL  COMPREHENSIVE METABOLIC PANEL     Status: Abnormal   Collection Time    08/13/12  5:10 PM      Result Value Range   Sodium 134 (*) 135 - 145 mEq/L   Potassium 3.4 (*) 3.5 - 5.1 mEq/L   Chloride 104  96 - 112 mEq/L   CO2 21  19 - 32 mEq/L   Glucose, Bld 100 (*)  70 - 99 mg/dL   BUN 6  6 - 23 mg/dL   Creatinine, Ser 7.84  0.50 - 1.10 mg/dL   Calcium 8.6  8.4 - 69.6 mg/dL   Total Protein 5.8 (*) 6.0 - 8.3 g/dL   Albumin 2.1 (*) 3.5 - 5.2 g/dL   AST 19  0 - 37 U/L   ALT 9  0 - 35 U/L   Alkaline Phosphatase 254 (*) 39 - 117 U/L   Total Bilirubin 0.3  0.3 - 1.2 mg/dL   GFR calc non Af Amer >90  >90 mL/min   GFR calc Af Amer >90  >90 mL/min  PROTEIN / CREATININE RATIO, URINE     Status: Abnormal   Collection Time    08/13/12  5:20 PM      Result Value Range   Creatinine, Urine 166.74     Total Protein, Urine 43.3     PROTEIN CREATININE RATIO 0.26 (*) 0.00 - 0.15     Assessment and Plan  A:  SIUP at [redacted]w[redacted]d        Gestational edema with proteinuria and labile blood pressures  P:  Admit to antenatal for observation      24 hr urine      NST q shift   Kevin Fenton 08/13/2012, 6:51 PM

## 2012-08-13 NOTE — H&P (Signed)
This is a 32 y.o. female at [redacted]w[redacted]d who is being admitted for observation for a 24 hr urine to rule out preeclampsia.  History     CSN: [id number]  Arrival date and time: 08/13/12 1654   First Provider Initiated Contact with Patient 08/13/12 1839      Chief Complaint  Patient presents with  . Abdominal Pain  . Edema   HPI 32 y/o G1P0 at 36.6 with abd pain, headache, and worsening edema. She has a pounding, 8/10, frontal headache that began this morning. It is not worsened by light, sound, scents and is not accompanied by increased nasuea. She began having black squiggly scotoma today which she has never had. She was recently seen i n the MAU a few days ago for pre-eclampsia. Her edema has been progressively worsening. She notes that her hands and face also get swollen adn she gets transient numbness in her hands. She has had decreased fetal movent throughout today. She denies vaginal LOF/bleeding/discahrge/irritation. She has been having contractions for about 1 week which have been progressivley worsening and are now an 8/10. She has Lower abdominal sharp pain radiates to her groin that worsens with standing or walking. She has had sharp RUQ pain that comes and goes she is attributing to her contractions.   OB History   Grav Para Term Preterm Abortions TAB SAB Ect Mult Living   1         0      Past Medical History  Diagnosis Date  . Blood type, Rh negative   . Hyperemesis arising during pregnancy   . Obesity   . Fibroid     Past Surgical History  Procedure Laterality Date  . No past surgeries      Family History  Problem Relation Age of Onset  . Hypertension Mother   . Cancer Sister 85    breast  . Diabetes Father     History  Substance Use Topics  . Smoking status: Never Smoker   . Smokeless tobacco: Never Used  . Alcohol Use: No    Allergies:  Allergies  Allergen Reactions  . Latex Itching and Rash    Prescriptions prior to admission  Medication Sig  Dispense Refill  . ondansetron (ZOFRAN) 8 MG tablet Take 8 mg by mouth daily as needed for nausea.      . pantoprazole (PROTONIX) 40 MG tablet Take 1 tablet (40 mg total) by mouth daily.  30 tablet  5  . Prenatal Vit-Fe Fumarate-FA (PRENATAL MULTIVITAMIN) TABS Take 1 tablet by mouth at bedtime.        ROS Per HPI Physical Exam   Blood pressure 118/74, pulse 97, temperature 98 F (36.7 C), temperature source Oral, resp. rate 20, height 5\' 4"  (1.626 m), weight 137.168 kg (302 lb 6.4 oz), last menstrual period 11/29/2011, SpO2 99.00%. Filed Vitals:   08/13/12 1832 08/13/12 1842 08/13/12 1852 08/13/12 1902  BP: 127/77 131/77 129/75 126/69  Pulse: 91 83 89 89  Temp:      TempSrc:      Resp:      Height:      Weight:      SpO2:         Physical Exam Gen: NAD, alert, cooperative with exam HEENT: NCAT CV: RRR, good S1/S2, no murmur Resp: CTABL, no wheezes, non-labored Abd: Soft, non tender pregnant abdomen Ext: 2-3+ edema on BL LE Neuro: Alert and oriented, No gross deficits, 2+ DTRs, 1 beat of clonus on BL LE.  SVE: Cervix fingertip 60 % effaced, vertex   FHT: Reactive Toco: irregular contractions every 3-4 minutes  Dilation: 1 Effacement (%): 50 Cervical Position: Posterior Station: -3 Presentation: Undeterminable Exam by:: Laurell Josephs RN  MAU Course  Procedures  Results for orders placed during the hospital encounter of 08/13/12 (from the past 24 hour(s))  CBC     Status: Abnormal   Collection Time    08/13/12  5:10 PM      Result Value Range   WBC 10.0  4.0 - 10.5 K/uL   RBC 3.94  3.87 - 5.11 MIL/uL   Hemoglobin 10.2 (*) 12.0 - 15.0 g/dL   HCT 16.1 (*) 09.6 - 04.5 %   MCV 78.9  78.0 - 100.0 fL   MCH 25.9 (*) 26.0 - 34.0 pg   MCHC 32.8  30.0 - 36.0 g/dL   RDW 40.9  81.1 - 91.4 %   Platelets 304  150 - 400 K/uL  COMPREHENSIVE METABOLIC PANEL     Status: Abnormal   Collection Time    08/13/12  5:10 PM      Result Value Range   Sodium 134 (*) 135 - 145  mEq/L   Potassium 3.4 (*) 3.5 - 5.1 mEq/L   Chloride 104  96 - 112 mEq/L   CO2 21  19 - 32 mEq/L   Glucose, Bld 100 (*) 70 - 99 mg/dL   BUN 6  6 - 23 mg/dL   Creatinine, Ser 7.82  0.50 - 1.10 mg/dL   Calcium 8.6  8.4 - 95.6 mg/dL   Total Protein 5.8 (*) 6.0 - 8.3 g/dL   Albumin 2.1 (*) 3.5 - 5.2 g/dL   AST 19  0 - 37 U/L   ALT 9  0 - 35 U/L   Alkaline Phosphatase 254 (*) 39 - 117 U/L   Total Bilirubin 0.3  0.3 - 1.2 mg/dL   GFR calc non Af Amer >90  >90 mL/min   GFR calc Af Amer >90  >90 mL/min  PROTEIN / CREATININE RATIO, URINE     Status: Abnormal   Collection Time    08/13/12  5:20 PM      Result Value Range   Creatinine, Urine 166.74     Total Protein, Urine 43.3     PROTEIN CREATININE RATIO 0.26 (*) 0.00 - 0.15     Assessment and Plan  A:  SIUP at [redacted]w[redacted]d        Gestational edema with proteinuria and labile blood pressures  P:  Admit to antenatal for observation      24 hr urine      NST q shift   Kevin Fenton 08/13/2012, 6:51 PM

## 2012-08-13 NOTE — H&P (Signed)
Agree with note  James G Arnold, MD . 

## 2012-08-14 DIAGNOSIS — R51 Headache: Secondary | ICD-10-CM

## 2012-08-14 DIAGNOSIS — IMO0002 Reserved for concepts with insufficient information to code with codable children: Secondary | ICD-10-CM

## 2012-08-14 DIAGNOSIS — O26839 Pregnancy related renal disease, unspecified trimester: Secondary | ICD-10-CM

## 2012-08-14 DIAGNOSIS — O479 False labor, unspecified: Secondary | ICD-10-CM

## 2012-08-14 MED ORDER — ONDANSETRON HCL 4 MG PO TABS
4.0000 mg | ORAL_TABLET | Freq: Once | ORAL | Status: AC
  Administered 2012-08-14: 4 mg via ORAL
  Filled 2012-08-14: qty 1

## 2012-08-14 NOTE — Discharge Summary (Signed)
Physician Discharge Summary  Patient ID: Rebekah Fowler MRN: [id number] DOB/AGE: 1980-05-25 32 y.o.  Admit date: 08/13/2012 Discharge date: 08/14/2012  Admission Diagnoses:Pregnancy 37 weeks, edema possible preeclampsia  Discharge Diagnoses: Normal blood pressure, no labor Active Problems:   * No active hospital problems. *   Discharged Condition: fair  Hospital Course: Patient with headache, modeerately elevate Pr/Cr ratio, contractions. No cx change, BP remained nl, o/w nl labs  Consults: None  Significant Diagnostic Studies: labs: P/C 0.26  Treatments: None  Discharge Exam: Blood pressure 127/69, pulse 81, temperature 98.4 F (36.9 C), temperature source Oral, resp. rate 20, height 5\' 4"  (1.626 m), weight 302 lb (136.986 kg), last menstrual period 11/29/2011, SpO2 99.00%. General appearance: alert, cooperative and no distress GI: gravic not tender Extremities: edema 2+ LE Cx posterior closed, long Disposition: 01-Home or Self Care Bring 24 hr urine to office  Future Appointments Provider Department Dept Phone   08/17/2012 10:45 AM Lesly Dukes, MD [facility] [place] [phone]       Medication List    TAKE these medications       ondansetron 8 MG tablet  Commonly known as:  ZOFRAN  Take 8 mg by mouth daily as needed for nausea.     pantoprazole 40 MG tablet  Commonly known as:  PROTONIX  Take 1 tablet (40 mg total) by mouth daily.     prenatal multivitamin Tabs  Take 1 tablet by mouth at bedtime.           Follow-up Information   Follow up with [facility] [employer] In 1 day.   Contact information:   [address], [address] [place] [address] [phone]      Signed: Scheryl Darter 08/14/2012, 6:51 AM

## 2012-08-15 ENCOUNTER — Encounter: Payer: Self-pay | Admitting: Family

## 2012-08-15 ENCOUNTER — Telehealth: Payer: Self-pay | Admitting: *Deleted

## 2012-08-15 DIAGNOSIS — O163 Unspecified maternal hypertension, third trimester: Secondary | ICD-10-CM

## 2012-08-15 LAB — COMPREHENSIVE METABOLIC PANEL
ALT: <8 U/L (ref 0–35)
AST: 10 U/L (ref 0–37)
Albumin: 2.7 g/dL — ABNORMAL LOW (ref 3.5–5.2)
Alkaline Phosphatase: 194 U/L — ABNORMAL HIGH (ref 39–117)
Calcium: 7.9 mg/dL — ABNORMAL LOW (ref 8.4–10.5)
Chloride: 108 mEq/L (ref 96–112)
Creat: 0.58 mg/dL (ref 0.50–1.10)
Potassium: 3.7 mEq/L (ref 3.5–5.3)

## 2012-08-15 NOTE — Telephone Encounter (Signed)
24 hr urine received and labs drawn

## 2012-08-15 NOTE — Addendum Note (Signed)
Addended by: Granville Lewis on: 08/15/2012 11:30 AM   Modules accepted: Orders

## 2012-08-16 LAB — CREATININE CLEARANCE, URINE, 24 HOUR

## 2012-08-16 LAB — CBC
Hemoglobin: 10.3 g/dL — ABNORMAL LOW (ref 12.0–15.0)
MCH: 25.9 pg — ABNORMAL LOW (ref 26.0–34.0)
MCHC: 31.3 g/dL (ref 30.0–36.0)
MCV: 82.7 fL (ref 78.0–100.0)
RBC: 3.98 MIL/uL (ref 3.87–5.11)

## 2012-08-16 LAB — PROTEIN, URINE, 24 HOUR

## 2012-08-17 ENCOUNTER — Encounter: Payer: Medicaid Other | Admitting: Obstetrics & Gynecology

## 2012-08-17 ENCOUNTER — Inpatient Hospital Stay (HOSPITAL_COMMUNITY)
Admission: AD | Admit: 2012-08-17 | Discharge: 2012-08-21 | DRG: 765 | Disposition: A | Discharge status: Home / Self Care | Payer: Medicaid Other | Source: Ambulatory Visit | Attending: Obstetrics & Gynecology | Admitting: Obstetrics & Gynecology

## 2012-08-17 ENCOUNTER — Encounter (HOSPITAL_COMMUNITY): Payer: Self-pay

## 2012-08-17 DIAGNOSIS — E669 Obesity, unspecified: Secondary | ICD-10-CM | POA: Diagnosis present

## 2012-08-17 DIAGNOSIS — Z302 Encounter for sterilization: Secondary | ICD-10-CM

## 2012-08-17 DIAGNOSIS — D259 Leiomyoma of uterus, unspecified: Secondary | ICD-10-CM | POA: Diagnosis present

## 2012-08-17 DIAGNOSIS — O99892 Other specified diseases and conditions complicating childbirth: Secondary | ICD-10-CM | POA: Diagnosis not present

## 2012-08-17 DIAGNOSIS — O411290 Chorioamnionitis, unspecified trimester, not applicable or unspecified: Secondary | ICD-10-CM | POA: Diagnosis not present

## 2012-08-17 DIAGNOSIS — O411090 Infection of amniotic sac and membranes, unspecified, unspecified trimester, not applicable or unspecified: Secondary | ICD-10-CM | POA: Diagnosis present

## 2012-08-17 DIAGNOSIS — I498 Other specified cardiac arrhythmias: Secondary | ICD-10-CM | POA: Diagnosis not present

## 2012-08-17 DIAGNOSIS — O34219 Maternal care for unspecified type scar from previous cesarean delivery: Secondary | ICD-10-CM | POA: Diagnosis present

## 2012-08-17 DIAGNOSIS — Z6841 Body Mass Index (BMI) 40.0 and over, adult: Secondary | ICD-10-CM

## 2012-08-17 DIAGNOSIS — O4290 Premature rupture of membranes, unspecified as to length of time between rupture and onset of labor, unspecified weeks of gestation: Secondary | ICD-10-CM | POA: Diagnosis present

## 2012-08-17 DIAGNOSIS — R404 Transient alteration of awareness: Secondary | ICD-10-CM | POA: Diagnosis not present

## 2012-08-17 DIAGNOSIS — O34599 Maternal care for other abnormalities of gravid uterus, unspecified trimester: Secondary | ICD-10-CM | POA: Diagnosis present

## 2012-08-17 DIAGNOSIS — R4182 Altered mental status, unspecified: Secondary | ICD-10-CM | POA: Diagnosis not present

## 2012-08-17 DIAGNOSIS — O1494 Unspecified pre-eclampsia, complicating childbirth: Secondary | ICD-10-CM | POA: Diagnosis present

## 2012-08-17 DIAGNOSIS — O99210 Obesity complicating pregnancy, unspecified trimester: Secondary | ICD-10-CM | POA: Diagnosis present

## 2012-08-17 DIAGNOSIS — D4959 Neoplasm of unspecified behavior of other genitourinary organ: Secondary | ICD-10-CM | POA: Diagnosis present

## 2012-08-17 DIAGNOSIS — Z3009 Encounter for other general counseling and advice on contraception: Secondary | ICD-10-CM

## 2012-08-17 DIAGNOSIS — IMO0002 Reserved for concepts with insufficient information to code with codable children: Secondary | ICD-10-CM

## 2012-08-17 HISTORY — DX: Gestational (pregnancy-induced) hypertension without significant proteinuria, unspecified trimester: O13.9

## 2012-08-17 LAB — TYPE AND SCREEN
DAT, IgG: NEGATIVE
Unit division: 0

## 2012-08-17 LAB — COMPREHENSIVE METABOLIC PANEL
ALT: 6 U/L (ref 0–35)
Albumin: 2 g/dL — ABNORMAL LOW (ref 3.5–5.2)
Alkaline Phosphatase: 197 U/L — ABNORMAL HIGH (ref 39–117)
BUN: 4 mg/dL — ABNORMAL LOW (ref 6–23)
Calcium: 7.6 mg/dL — ABNORMAL LOW (ref 8.4–10.5)
GFR calc Af Amer: >90 mL/min (ref >90)
Glucose, Bld: 97 mg/dL (ref 70–99)
Potassium: 3.5 mEq/L (ref 3.5–5.1)
Sodium: 132 mEq/L — ABNORMAL LOW (ref 135–145)
Total Protein: 5.2 g/dL — ABNORMAL LOW (ref 6.0–8.3)

## 2012-08-17 LAB — CBC
Hemoglobin: 9.5 g/dL — ABNORMAL LOW (ref 12.0–15.0)
MCH: 25.9 pg — ABNORMAL LOW (ref 26.0–34.0)
MCH: 25.8 pg — ABNORMAL LOW (ref 26.0–34.0)
MCHC: 32.9 g/dL (ref 30.0–36.0)
MCV: 78.9 fL (ref 78.0–100.0)
Platelets: 315 10*3/uL (ref 150–400)
RBC: 3.79 MIL/uL — ABNORMAL LOW (ref 3.87–5.11)
RDW: 14.5 % (ref 11.5–15.5)
RDW: 14.3 % (ref 11.5–15.5)

## 2012-08-17 MED ORDER — ACETAMINOPHEN 325 MG PO TABS
650.0000 mg | ORAL_TABLET | ORAL | Status: DC | PRN
  Administered 2012-08-18: 650 mg via ORAL
  Filled 2012-08-17: qty 1
  Filled 2012-08-17: qty 2

## 2012-08-17 MED ORDER — OXYCODONE-ACETAMINOPHEN 5-325 MG PO TABS: 1.0000 | ORAL_TABLET | ORAL | Status: DC | PRN

## 2012-08-17 MED ORDER — LIDOCAINE HCL (PF) 1 % IJ SOLN: 30.0000 mL | INTRAMUSCULAR | Status: DC | PRN

## 2012-08-17 MED ORDER — MAGNESIUM SULFATE 40 MG/ML IJ SOLN
4.0000 g | Freq: Once | INTRAMUSCULAR | Status: DC
  Filled 2012-08-17: qty 100

## 2012-08-17 MED ORDER — MAGNESIUM SULFATE BOLUS VIA INFUSION
4.0000 g | Freq: Once | INTRAVENOUS | Status: AC
  Administered 2012-08-17: 4 g via INTRAVENOUS
  Filled 2012-08-17: qty 500

## 2012-08-17 MED ORDER — CITRIC ACID-SODIUM CITRATE 334-500 MG/5ML PO SOLN
30.0000 mL | ORAL | Status: DC | PRN
  Administered 2012-08-18: 30 mL via ORAL
  Filled 2012-08-17: qty 15

## 2012-08-17 MED ORDER — ONDANSETRON HCL 4 MG/2ML IJ SOLN
4.0000 mg | Freq: Four times a day (QID) | INTRAMUSCULAR | Status: DC | PRN
  Administered 2012-08-17 – 2012-08-18 (×2): 4 mg via INTRAVENOUS
  Filled 2012-08-17 (×2): qty 2

## 2012-08-17 MED ORDER — FENTANYL CITRATE 0.05 MG/ML IJ SOLN
100.0000 ug | INTRAMUSCULAR | Status: DC | PRN
  Administered 2012-08-18: 100 ug via INTRAVENOUS
  Filled 2012-08-17: qty 2

## 2012-08-17 MED ORDER — OXYTOCIN 40 UNITS IN LACTATED RINGERS INFUSION - SIMPLE MED
1.0000 m[IU]/min | INTRAVENOUS | Status: DC
  Administered 2012-08-17: 2 m[IU]/min via INTRAVENOUS
  Administered 2012-08-18: 6 m[IU]/min via INTRAVENOUS
  Filled 2012-08-17: qty 1000

## 2012-08-17 MED ORDER — LACTATED RINGERS IV SOLN
INTRAVENOUS | Status: DC
  Administered 2012-08-17 – 2012-08-18 (×4): via INTRAVENOUS

## 2012-08-17 MED ORDER — OXYTOCIN BOLUS FROM INFUSION: 500.0000 mL | INTRAVENOUS | Status: DC

## 2012-08-17 MED ORDER — MISOPROSTOL 50MCG HALF TABLET
50.0000 ug | ORAL_TABLET | Freq: Once | ORAL | Status: AC
  Administered 2012-08-17: 50 ug via ORAL
  Filled 2012-08-17: qty 1

## 2012-08-17 MED ORDER — MAGNESIUM SULFATE 40 G IN LACTATED RINGERS - SIMPLE
2.0000 g/h | INTRAVENOUS | Status: DC
  Administered 2012-08-17 – 2012-08-18 (×2): 2 g/h via INTRAVENOUS
  Filled 2012-08-17 (×2): qty 500

## 2012-08-17 MED ORDER — FLEET ENEMA 7-19 GM/118ML RE ENEM: 1.0000 | ENEMA | RECTAL | Status: DC | PRN

## 2012-08-17 MED ORDER — MAGNESIUM SULFATE 40 G IN LACTATED RINGERS - SIMPLE
2.0000 g/h | INTRAVENOUS | Status: DC
  Filled 2012-08-17: qty 500

## 2012-08-17 MED ORDER — LACTATED RINGERS IV SOLN
500.0000 mL | INTRAVENOUS | Status: DC | PRN
  Administered 2012-08-18: 500 mL via INTRAVENOUS

## 2012-08-17 MED ORDER — IBUPROFEN 600 MG PO TABS: 600.0000 mg | ORAL_TABLET | Freq: Four times a day (QID) | ORAL | Status: DC | PRN

## 2012-08-17 MED ORDER — OXYTOCIN 40 UNITS IN LACTATED RINGERS INFUSION - SIMPLE MED: 62.5000 mL/h | INTRAVENOUS | Status: DC

## 2012-08-17 MED ORDER — TERBUTALINE SULFATE 1 MG/ML IJ SOLN
0.2500 mg | Freq: Once | INTRAMUSCULAR | Status: AC | PRN
  Filled 2012-08-17: qty 1

## 2012-08-17 MED ORDER — MAGNESIUM SULFATE BOLUS VIA INFUSION
4.0000 g | Freq: Once | INTRAVENOUS | Status: DC
  Filled 2012-08-17: qty 500

## 2012-08-17 MED ORDER — TERBUTALINE SULFATE 1 MG/ML IJ SOLN: 0.2500 mg | Freq: Once | INTRAMUSCULAR | Status: AC | PRN

## 2012-08-17 NOTE — Progress Notes (Signed)
Leftwich-Kirby, CNM, notified of CMET and CBC results. RN to notify CNM if blood pressures become greater than 160/100 consistently.

## 2012-08-17 NOTE — MAU Note (Signed)
Pt states that she thinks her water broke about 1130pm. States that it was clear fluid. Denies vaginal bleeding or contractions.

## 2012-08-17 NOTE — Progress Notes (Signed)
Rebekah Fowler is a 32 y.o. G1P0 at [redacted]w[redacted]d admitted for rupture of membranes  Subjective: Pt comfortable, denies cramping/contractions.  Objective: BP 154/89  Pulse 80  Temp(Src) 97.8 F (36.6 C) (Oral)  Resp 20  Ht 5\' 4"  (1.626 m)  Wt 302 lb (136.986 kg)  BMI 51.81 kg/m2  SpO2 97%  LMP 11/29/2011 I/O last 3 completed shifts: In: 171.7 [P.O.:30; I.V.:141.7] Out: 0  Total I/O In: 735 [P.O.:360; I.V.:375] Out: 200 [Urine:200]  FHT:  FHR: 130 bpm, variability: moderate,  accelerations:  Present,  decelerations:  Absent UC:   irregular, every 3-6 minutes SVE:   Dilation: 1 Effacement (%): 60 Station: -2 Exam by:: Leftwich-Kirby, CNM Foley bulb placed without difficulty by CNM and filled with 60 ml NS by RN, pt tolerated well  Labs: Lab Results  Component Value Date   WBC 11.6* 08/17/2012   HGB 9.8* 08/17/2012   HCT 29.9* 08/17/2012   MCV 78.9 08/17/2012   PLT 315 08/17/2012    Assessment / Plan: Augmentation of labor, progressing well  Labor: Progressing normally  Fetal Wellbeing:  Category I Pain Control:  Labor support without medications  Anticipated MOD:  NSVD  LEFTWICH-KIRBY, Eman Morimoto 08/17/2012, 10:17 AM

## 2012-08-17 NOTE — H&P (Signed)
Attestation of Attending Supervision of Advanced Practitioner (CNM/NP): Evaluation and management procedures were performed by the Advanced Practitioner under my supervision and collaboration.  I have reviewed the Advanced Practitioner's note and chart, and I agree with the management and plan.  HARRAWAY-SMITH, Myesha Stillion 6:58 AM

## 2012-08-17 NOTE — MAU Note (Signed)
Drenda Freeze CNM in to do speculum exam to check for pooling of amniotic fluid

## 2012-08-17 NOTE — H&P (Signed)
Rebekah Fowler is a 32 y.o. female G1P0 with IUP at [redacted]w[redacted]d presenting for ROM at 2330. Pt states she has been having occ contractions, associated with none vaginal bleeding.  Membranes are ruptured, clear fluid, with active fetal movement.   [facility] [place] since 8 wks Wants to delay IOL to see if she goes into labor Prenatal History/Complications:  Past Medical History: Past Medical History  Diagnosis Date  . Blood type, Rh negative   . Hyperemesis arising during pregnancy   . Obesity   . Fibroid   . Pregnancy induced hypertension     Past Surgical History: Past Surgical History  Procedure Laterality Date  . No past surgeries      Obstetrical History: OB History   Grav Para Term Preterm Abortions TAB SAB Ect Mult Living   1         0      Social History: History   Social History  . Marital Status: Married    Spouse Name: N/A    Number of Children: N/A  . Years of Education: N/A   Social History Main Topics  . Smoking status: Never Smoker   . Smokeless tobacco: Never Used  . Alcohol Use: No  . Drug Use: No  . Sexually Active: Yes -- Female partner(s)   Other Topics Concern  . None   Social History Narrative  . None    Family History: Family History  Problem Relation Age of Onset  . Hypertension Mother   . Cancer Sister 46    breast  . Diabetes Father     Allergies: Allergies  Allergen Reactions  . Latex Itching and Rash    Prescriptions prior to admission  Medication Sig Dispense Refill  . ondansetron (ZOFRAN) 8 MG tablet Take 8 mg by mouth daily as needed for nausea.      . pantoprazole (PROTONIX) 40 MG tablet Take 1 tablet (40 mg total) by mouth daily.  30 tablet  5  . Prenatal Vit-Fe Fumarate-FA (PRENATAL MULTIVITAMIN) TABS Take 1 tablet by mouth at bedtime.         Review of Systems   Constitutional: Negative for fever, chills, weight loss, malaise/fatigue and diaphoresis.  HENT: Negative for hearing loss, ear pain, nosebleeds,  congestion, sore throat, neck pain, tinnitus and ear discharge.   Eyes: Negative for blurred vision, double vision, photophobia, pain, discharge and redness.  Respiratory: Negative for cough, hemoptysis, sputum production, shortness of breath, wheezing and stridor.   Cardiovascular: Negative for chest pain, palpitations, orthopnea,  leg swelling  Gastrointestinal: Positive for abdominal pain. Negative for heartburn, nausea, vomiting, diarrhea, constipation, blood in stool Genitourinary: Negative for dysuria, urgency, frequency, hematuria and flank pain.  Musculoskeletal: Negative for myalgias, back pain, joint pain and falls.  Skin: Negative for itching and rash.  Neurological: Negative for dizziness, tingling, tremors, sensory change, speech change, focal weakness, seizures, loss of consciousness, weakness and headaches.  Endo/Heme/Allergies: Negative for environmental allergies and polydipsia. Does not bruise/bleed easily.  Psychiatric/Behavioral: Negative for depression, suicidal ideas, hallucinations, memory loss and substance abuse. The patient is not nervous/anxious and does not have insomnia.       Blood pressure 132/68, pulse 79, temperature 98.5 F (36.9 C), temperature source Oral, resp. rate 18, last menstrual period 11/29/2011, SpO2 100.00%. General appearance: alert, cooperative and no distress Lungs: clear to auscultation bilaterally Heart: regular rate and rhythm Abdomen: soft, non-tender; bowel sounds normal Pelvic: SSE:  + pooling, gross ROM Extremities: Homans sign is negative, no sign  of DVT DTR's 3+ Presentation: cephalic Fetal monitoringBaseline: 140 bpm, Variability: Good {> 6 bpm), Accelerations: Reactive and Decelerations: Absent Uterine activity:  occ mild contractions      Prenatal labs: ABO, Rh: --/--/O NEG (03/31 1710) Antibody: POS (03/31 1710) Rubella:   RPR: NON REAC (01/31 1132)  HBsAg: NEGATIVE (09/13 1138)  HIV: NON REACTIVE (01/31 1132)  GBS:    neg  Glucola Had normal 3hr 07/11/12 (85/168/171/102);  Note in PNR by D. Poe, CNM sites abnormal values in the 200's, but not found in labs.  I'm guessing it was a mistake) Genetic screening  normal Anatomy US normal Results for orders placed during the hospital encounter of 08/17/12 (from the past 72 hour(s))  CBC     Status: Abnormal   Collection Time    08/17/12  2:15 AM      Result Value Range   WBC 11.6 (*) 4.0 - 10.5 K/uL   RBC 3.79 (*) 3.87 - 5.11 MIL/uL   Hemoglobin 9.8 (*) 12.0 - 15.0 g/dL   HCT 16.1 (*) 09.6 - 04.5 %   MCV 78.9  78.0 - 100.0 fL   MCH 25.9 (*) 26.0 - 34.0 pg   MCHC 32.8  30.0 - 36.0 g/dL   RDW 40.9  81.1 - 91.4 %   Platelets 315  150 - 400 K/uL   24 hour protein 4/2 was 357mg   Filed Vitals:   08/17/12 0205  BP: 120/78  Pulse: 71  Temp: 98.1 F (36.7 C)  Resp: 18     Assessment: Rebekah Fowler is a 32 y.o. G1P0 with an IUP at [redacted]w[redacted]d presenting for PROM  Preeclampsia, mild  Plan: If not in active labor by 0600, will start IOL  Start MgSO4 per Dr. Erin Fulling when in labor or if B/P increases to abnormal rangessss   CRESENZO-DISHMAN,Anjolaoluwa Siguenza 08/17/2012, 1:58 AM

## 2012-08-17 NOTE — Progress Notes (Signed)
Rebekah Fowler is a 32 y.o. G1P0 at [redacted]w[redacted]d admitted for rupture of membranes  Subjective: Pt comfortable, feeling some mild pressure but no cramping/contractions since Cytotec PO at 6:00 am.  Family at bedside for support.  Objective: BP 154/89  Pulse 80  Temp(Src) 97.8 F (36.6 C) (Oral)  Resp 18  Ht 5\' 4"  (1.626 m)  Wt 302 lb (136.986 kg)  BMI 51.81 kg/m2  SpO2 97%  LMP 11/29/2011 I/O last 3 completed shifts: In: 171.7 [P.O.:30; I.V.:141.7] Out: 0  Total I/O In: 520 [P.O.:270; I.V.:250] Out: 200 [Urine:200]  FHT:  FHR: 130 bpm, variability: moderate,  accelerations:  Present,  decelerations:  Absent UC:   irregular, every 3-8 minutes SVE:   Deferred  Labs: Lab Results  Component Value Date   WBC 11.6* 08/17/2012   HGB 9.8* 08/17/2012   HCT 29.9* 08/17/2012   MCV 78.9 08/17/2012   PLT 315 08/17/2012    Assessment / Plan: Augmentation of labor, progressing well  Labor: Progressing normally  Fetal Wellbeing:  Category I Pain Control:  Labor support without medications  Anticipated MOD:  NSVD  LEFTWICH-KIRBY, Cleotha Tsang 08/17/2012, 10:01 AM

## 2012-08-17 NOTE — Progress Notes (Signed)
Rebekah Fowler is a 32 y.o. G1P0 at [redacted]w[redacted]d admitted for induction of labor due to Spontaneous rupture of BOW.  Subjective: Pt comfortable, feeling some intermittent cramping.    Objective: BP 149/77  Pulse 78  Temp(Src) 97.7 F (36.5 C) (Oral)  Resp 20  Ht 5\' 4"  (1.626 m)  Wt 136.986 kg (302 lb)  BMI 51.81 kg/m2  SpO2 97%  LMP 11/29/2011 I/O last 3 completed shifts: In: 171.7 [P.O.:30; I.V.:141.7] Out: 0  Total I/O In: 1755 [P.O.:630; I.V.:1125] Out: 800 [Urine:800]  FHT:  FHR: 130 bpm, variability: moderate,  accelerations:  Present,  decelerations:  Absent UC:   irregular, every 10+ minutes SVE:   Dilation: 4 Effacement (%): 60 Cervical Position: Anterior Station: -2 Presentation: Vertex Exam [id number], CNM  Foley bulb noted to be through cervix and in vagina, removed by CNM  Labs: Lab Results  Component Value Date   WBC 11.6* 08/17/2012   HGB 9.8* 08/17/2012   HCT 29.9* 08/17/2012   MCV 78.9 08/17/2012   PLT 315 08/17/2012    Assessment / Plan: Induction of labor due to PROM S/P Foley bulb  Labor: Progressing normally, Plan for pt to eat, then start Pitocin in 1 hour Preeclampsia:  Elevated BPs 150s/80s in last few hours, plan to draw labs at this time.  Pt denies h/a, epigastric pain, or visual disturbances. Fetal Wellbeing:  Category I Pain Control:  Labor support without medications  Anticipated MOD:  NSVD  LEFTWICH-KIRBY, Ell Tiso 08/17/2012, 4:20 PM

## 2012-08-17 NOTE — Progress Notes (Signed)
Rebekah Fowler is a 32 y.o. G1P0 at [redacted]w[redacted]d admitted for rupture of membranes  Subjective:   Objective: BP 149/81  Pulse 89  Temp(Src) 98 F (36.7 C) (Oral)  Resp 18  Ht 5\' 4"  (1.626 m)  Wt 136.986 kg (302 lb)  BMI 51.81 kg/m2  SpO2 97%  LMP 11/29/2011 I/O last 3 completed shifts: In: 2661.9 [P.O.:1020; I.V.:1641.9] Out: 800 [Urine:800] Total I/O In: 431.6 [P.O.:180; I.V.:251.6] Out: 600 [Urine:600]  FHT:  FHR: 140 bpm, variability: moderate,  accelerations:  Present,  decelerations:  Absent UC:   irregular, every 4 minutes SVE:   Dilation: 5 Effacement (%): 60 Station: -2 Exam by:: Dr. Katrine Coho  Labs: Lab Results  Component Value Date   WBC 13.2* 08/17/2012   HGB 9.5* 08/17/2012   HCT 28.9* 08/17/2012   MCV 78.5 08/17/2012   PLT 298 08/17/2012    Assessment / Plan: Augmentation of labor, progressing well, IUPC placed  Labor: Progressing normally and on Pitocin Fetal Wellbeing:  Category I Pain Control:  Labor support without medications I/D:  GBS neg Anticipated MOD:  NSVD  MERRELL, DAVID, MD Family Medicine Resident PGY-2 08/17/2012, 9:47 PM

## 2012-08-18 ENCOUNTER — Encounter (HOSPITAL_COMMUNITY): Payer: Self-pay | Admitting: *Deleted

## 2012-08-18 ENCOUNTER — Encounter (HOSPITAL_COMMUNITY)
Admission: AD | Disposition: A | Discharge status: Home / Self Care | Payer: Self-pay | Source: Ambulatory Visit | Attending: Obstetrics & Gynecology

## 2012-08-18 ENCOUNTER — Inpatient Hospital Stay (HOSPITAL_COMMUNITY): Payer: Medicaid Other

## 2012-08-18 ENCOUNTER — Encounter (HOSPITAL_COMMUNITY): Payer: Self-pay | Admitting: Anesthesiology

## 2012-08-18 ENCOUNTER — Inpatient Hospital Stay (HOSPITAL_COMMUNITY): Payer: Medicaid Other | Admitting: Anesthesiology

## 2012-08-18 DIAGNOSIS — O411290 Chorioamnionitis, unspecified trimester, not applicable or unspecified: Secondary | ICD-10-CM | POA: Diagnosis not present

## 2012-08-18 DIAGNOSIS — O4290 Premature rupture of membranes, unspecified as to length of time between rupture and onset of labor, unspecified weeks of gestation: Secondary | ICD-10-CM | POA: Diagnosis present

## 2012-08-18 DIAGNOSIS — O1494 Unspecified pre-eclampsia, complicating childbirth: Secondary | ICD-10-CM | POA: Diagnosis present

## 2012-08-18 DIAGNOSIS — Z6841 Body Mass Index (BMI) 40.0 and over, adult: Secondary | ICD-10-CM

## 2012-08-18 DIAGNOSIS — Z302 Encounter for sterilization: Secondary | ICD-10-CM

## 2012-08-18 DIAGNOSIS — R404 Transient alteration of awareness: Secondary | ICD-10-CM | POA: Diagnosis not present

## 2012-08-18 DIAGNOSIS — O411090 Infection of amniotic sac and membranes, unspecified, unspecified trimester, not applicable or unspecified: Secondary | ICD-10-CM

## 2012-08-18 DIAGNOSIS — Z3009 Encounter for other general counseling and advice on contraception: Secondary | ICD-10-CM

## 2012-08-18 LAB — CBC
HCT: 25.8 % — ABNORMAL LOW (ref 36.0–46.0)
MCH: 25.7 pg — ABNORMAL LOW (ref 26.0–34.0)
MCHC: 32.9 g/dL (ref 30.0–36.0)
Platelets: 329 10*3/uL (ref 150–400)
Platelets: 265 10*3/uL (ref 150–400)
RDW: 14.4 % (ref 11.5–15.5)
RDW: 14.3 % (ref 11.5–15.5)

## 2012-08-18 LAB — COMPREHENSIVE METABOLIC PANEL
ALT: 6 U/L (ref 0–35)
AST: 11 U/L (ref 0–37)
CO2: 21 mEq/L (ref 19–32)
Chloride: 106 mEq/L (ref 96–112)
GFR calc Af Amer: >90 mL/min (ref >90)
GFR calc non Af Amer: >90 mL/min (ref >90)
Glucose, Bld: 108 mg/dL — ABNORMAL HIGH (ref 70–99)
Sodium: 136 mEq/L (ref 135–145)
Total Bilirubin: 0.3 mg/dL (ref 0.3–1.2)

## 2012-08-18 LAB — MAGNESIUM: Magnesium: 4.3 mg/dL — ABNORMAL HIGH (ref 1.5–2.5)

## 2012-08-18 SURGERY — Surgical Case
Anesthesia: Epidural | Site: Abdomen | Wound class: Clean Contaminated

## 2012-08-18 MED ORDER — LACTATED RINGERS IV SOLN
INTRAVENOUS | Status: DC | PRN
  Administered 2012-08-18: 17:00:00 via INTRAVENOUS

## 2012-08-18 MED ORDER — HYDRALAZINE HCL 20 MG/ML IJ SOLN
10.0000 mg | Freq: Once | INTRAMUSCULAR | Status: AC
  Administered 2012-08-18: 10 mg via INTRAVENOUS

## 2012-08-18 MED ORDER — DEXTROSE 5 % IV SOLN
2.0000 g | Freq: Two times a day (BID) | INTRAVENOUS | Status: DC
  Administered 2012-08-18 – 2012-08-19 (×2): 2 g via INTRAVENOUS
  Filled 2012-08-18 (×3): qty 2

## 2012-08-18 MED ORDER — ACETAMINOPHEN 10 MG/ML IV SOLN
INTRAVENOUS | Status: DC | PRN
  Administered 2012-08-18: 1000 mg via INTRAVENOUS

## 2012-08-18 MED ORDER — FENTANYL 2.5 MCG/ML BUPIVACAINE 1/10 % EPIDURAL INFUSION (WH - ANES)
14.0000 mL/h | INTRAMUSCULAR | Status: DC | PRN
  Administered 2012-08-18: 14 mL/h via EPIDURAL
  Filled 2012-08-18: qty 125

## 2012-08-18 MED ORDER — HYDRALAZINE HCL 20 MG/ML IJ SOLN
INTRAMUSCULAR | Status: AC
  Filled 2012-08-18: qty 1

## 2012-08-18 MED ORDER — KETOROLAC TROMETHAMINE 30 MG/ML IJ SOLN: 30.0000 mg | Freq: Four times a day (QID) | INTRAMUSCULAR | Status: AC | PRN

## 2012-08-18 MED ORDER — ONDANSETRON HCL 4 MG/2ML IJ SOLN
INTRAMUSCULAR | Status: AC
  Filled 2012-08-18: qty 2

## 2012-08-18 MED ORDER — DIPHENHYDRAMINE HCL 50 MG/ML IJ SOLN: 12.5000 mg | INTRAMUSCULAR | Status: DC | PRN

## 2012-08-18 MED ORDER — SIMETHICONE 80 MG PO CHEW
80.0000 mg | CHEWABLE_TABLET | Freq: Three times a day (TID) | ORAL | Status: DC
  Administered 2012-08-18 – 2012-08-21 (×9): 80 mg via ORAL

## 2012-08-18 MED ORDER — LIDOCAINE HCL (PF) 1 % IJ SOLN
INTRAMUSCULAR | Status: DC | PRN
  Administered 2012-08-18: 3 mL
  Administered 2012-08-18 (×4): 4 mL

## 2012-08-18 MED ORDER — BUPIVACAINE HCL (PF) 0.25 % IJ SOLN
INTRAMUSCULAR | Status: AC
  Filled 2012-08-18: qty 30

## 2012-08-18 MED ORDER — DIPHENHYDRAMINE HCL 25 MG PO CAPS: 25.0000 mg | ORAL_CAPSULE | ORAL | Status: DC | PRN

## 2012-08-18 MED ORDER — PHENYLEPHRINE 40 MCG/ML (10ML) SYRINGE FOR IV PUSH (FOR BLOOD PRESSURE SUPPORT): 80.0000 ug | PREFILLED_SYRINGE | INTRAVENOUS | Status: DC | PRN

## 2012-08-18 MED ORDER — OXYCODONE-ACETAMINOPHEN 5-325 MG PO TABS
1.0000 | ORAL_TABLET | ORAL | Status: DC | PRN
  Administered 2012-08-20: 1 via ORAL
  Filled 2012-08-18: qty 1

## 2012-08-18 MED ORDER — MENTHOL 3 MG MT LOZG: 1.0000 | LOZENGE | OROMUCOSAL | Status: DC | PRN

## 2012-08-18 MED ORDER — SODIUM CHLORIDE 0.9 % IR SOLN
Status: DC | PRN
  Administered 2012-08-18: 1000 mL

## 2012-08-18 MED ORDER — SCOPOLAMINE 1 MG/3DAYS TD PT72: 1.0000 | MEDICATED_PATCH | Freq: Once | TRANSDERMAL | Status: DC

## 2012-08-18 MED ORDER — NALBUPHINE HCL 10 MG/ML IJ SOLN: 5.0000 mg | INTRAMUSCULAR | Status: DC | PRN

## 2012-08-18 MED ORDER — ONDANSETRON HCL 4 MG/2ML IJ SOLN: 4.0000 mg | Freq: Three times a day (TID) | INTRAMUSCULAR | Status: DC | PRN

## 2012-08-18 MED ORDER — OXYTOCIN 10 UNIT/ML IJ SOLN
INTRAMUSCULAR | Status: AC
  Filled 2012-08-18: qty 4

## 2012-08-18 MED ORDER — MORPHINE SULFATE (PF) 0.5 MG/ML IJ SOLN
INTRAMUSCULAR | Status: DC | PRN
  Administered 2012-08-18: 4 mg via EPIDURAL
  Administered 2012-08-18: 1 mg via INTRAVENOUS

## 2012-08-18 MED ORDER — DIPHENHYDRAMINE HCL 25 MG PO CAPS: 25.0000 mg | ORAL_CAPSULE | Freq: Four times a day (QID) | ORAL | Status: DC | PRN

## 2012-08-18 MED ORDER — ONDANSETRON HCL 4 MG/2ML IJ SOLN
INTRAMUSCULAR | Status: DC | PRN
  Administered 2012-08-18: 4 mg via INTRAVENOUS

## 2012-08-18 MED ORDER — MORPHINE SULFATE 0.5 MG/ML IJ SOLN
INTRAMUSCULAR | Status: AC
  Filled 2012-08-18: qty 10

## 2012-08-18 MED ORDER — METOCLOPRAMIDE HCL 5 MG/ML IJ SOLN: 10.0000 mg | Freq: Three times a day (TID) | INTRAMUSCULAR | Status: DC | PRN

## 2012-08-18 MED ORDER — KETOROLAC TROMETHAMINE 60 MG/2ML IM SOLN
INTRAMUSCULAR | Status: AC
  Filled 2012-08-18: qty 2

## 2012-08-18 MED ORDER — PHENYLEPHRINE 40 MCG/ML (10ML) SYRINGE FOR IV PUSH (FOR BLOOD PRESSURE SUPPORT)
PREFILLED_SYRINGE | INTRAVENOUS | Status: AC
  Filled 2012-08-18: qty 5

## 2012-08-18 MED ORDER — GENTAMICIN SULFATE 40 MG/ML IJ SOLN
170.0000 mg | Freq: Three times a day (TID) | INTRAVENOUS | Status: DC
  Administered 2012-08-18: 170 mg via INTRAVENOUS
  Filled 2012-08-18 (×3): qty 4.25

## 2012-08-18 MED ORDER — ACETAMINOPHEN 650 MG RE SUPP: 975.0000 mg | RECTAL | Status: DC | PRN

## 2012-08-18 MED ORDER — EPHEDRINE 5 MG/ML INJ: 10.0000 mg | INTRAVENOUS | Status: DC | PRN

## 2012-08-18 MED ORDER — MAGNESIUM SULFATE 40 G IN LACTATED RINGERS - SIMPLE
2.0000 g/h | INTRAVENOUS | Status: DC
  Administered 2012-08-18 – 2012-08-19 (×2): 2 g/h via INTRAVENOUS
  Filled 2012-08-18: qty 500

## 2012-08-18 MED ORDER — KETOROLAC TROMETHAMINE 60 MG/2ML IM SOLN
60.0000 mg | Freq: Once | INTRAMUSCULAR | Status: AC | PRN
  Administered 2012-08-18: 60 mg via INTRAMUSCULAR

## 2012-08-18 MED ORDER — IBUPROFEN 600 MG PO TABS
600.0000 mg | ORAL_TABLET | Freq: Four times a day (QID) | ORAL | Status: DC
  Administered 2012-08-19 – 2012-08-21 (×10): 600 mg via ORAL
  Filled 2012-08-18 (×10): qty 1

## 2012-08-18 MED ORDER — DEXTROSE IN LACTATED RINGERS 5 % IV SOLN: INTRAVENOUS | Status: DC

## 2012-08-18 MED ORDER — WITCH HAZEL-GLYCERIN EX PADS: 1.0000 "application " | MEDICATED_PAD | CUTANEOUS | Status: DC | PRN

## 2012-08-18 MED ORDER — ZOLPIDEM TARTRATE 5 MG PO TABS: 5.0000 mg | ORAL_TABLET | Freq: Every evening | ORAL | Status: DC | PRN

## 2012-08-18 MED ORDER — SCOPOLAMINE 1 MG/3DAYS TD PT72
MEDICATED_PATCH | TRANSDERMAL | Status: AC
  Administered 2012-08-18: 1.5 mg via TRANSDERMAL
  Filled 2012-08-18: qty 1

## 2012-08-18 MED ORDER — LACTATED RINGERS IV SOLN
INTRAVENOUS | Status: DC
  Administered 2012-08-18 (×2): via INTRAUTERINE

## 2012-08-18 MED ORDER — MEPERIDINE HCL 25 MG/ML IJ SOLN: 6.2500 mg | INTRAMUSCULAR | Status: DC | PRN

## 2012-08-18 MED ORDER — LIDOCAINE-EPINEPHRINE (PF) 2 %-1:200000 IJ SOLN
INTRAMUSCULAR | Status: AC
  Filled 2012-08-18: qty 20

## 2012-08-18 MED ORDER — EPHEDRINE 5 MG/ML INJ
INTRAVENOUS | Status: AC
  Filled 2012-08-18: qty 4

## 2012-08-18 MED ORDER — DEXTROSE 5 % IV SOLN
3.0000 g | INTRAVENOUS | Status: DC | PRN
  Administered 2012-08-18: 3 g via INTRAVENOUS

## 2012-08-18 MED ORDER — DIPHENHYDRAMINE HCL 50 MG/ML IJ SOLN: 25.0000 mg | INTRAMUSCULAR | Status: DC | PRN

## 2012-08-18 MED ORDER — ONDANSETRON HCL 4 MG PO TABS
4.0000 mg | ORAL_TABLET | ORAL | Status: DC | PRN
  Administered 2012-08-19: 4 mg via ORAL
  Filled 2012-08-18: qty 1

## 2012-08-18 MED ORDER — SODIUM CHLORIDE 0.9 % IJ SOLN: 3.0000 mL | INTRAMUSCULAR | Status: DC | PRN

## 2012-08-18 MED ORDER — LANOLIN HYDROUS EX OINT: 1.0000 "application " | TOPICAL_OINTMENT | CUTANEOUS | Status: DC | PRN

## 2012-08-18 MED ORDER — ACETAMINOPHEN 10 MG/ML IV SOLN
INTRAVENOUS | Status: AC
  Filled 2012-08-18: qty 100

## 2012-08-18 MED ORDER — SODIUM BICARBONATE 8.4 % IV SOLN
INTRAVENOUS | Status: DC | PRN
  Administered 2012-08-18: 10 mL via EPIDURAL

## 2012-08-18 MED ORDER — HYDROMORPHONE HCL PF 1 MG/ML IJ SOLN: 0.2500 mg | INTRAMUSCULAR | Status: DC | PRN

## 2012-08-18 MED ORDER — BUPIVACAINE HCL (PF) 0.25 % IJ SOLN
INTRAMUSCULAR | Status: DC | PRN
  Administered 2012-08-18: 30 mL

## 2012-08-18 MED ORDER — NALOXONE HCL 0.4 MG/ML IJ SOLN: 0.4000 mg | INTRAMUSCULAR | Status: DC | PRN

## 2012-08-18 MED ORDER — PHENYLEPHRINE HCL 10 MG/ML IJ SOLN
INTRAMUSCULAR | Status: DC | PRN
  Administered 2012-08-18: 40 ug via INTRAVENOUS
  Administered 2012-08-18 (×2): 80 ug via INTRAVENOUS

## 2012-08-18 MED ORDER — SENNOSIDES-DOCUSATE SODIUM 8.6-50 MG PO TABS
2.0000 | ORAL_TABLET | Freq: Every day | ORAL | Status: DC
  Administered 2012-08-18 – 2012-08-20 (×3): 2 via ORAL

## 2012-08-18 MED ORDER — LACTATED RINGERS IV SOLN: 500.0000 mL | Freq: Once | INTRAVENOUS | Status: DC

## 2012-08-18 MED ORDER — PRENATAL MULTIVITAMIN CH
1.0000 | ORAL_TABLET | Freq: Every day | ORAL | Status: DC
  Administered 2012-08-19 – 2012-08-21 (×3): 1 via ORAL
  Filled 2012-08-18 (×3): qty 1

## 2012-08-18 MED ORDER — PROMETHAZINE HCL 25 MG/ML IJ SOLN: 6.2500 mg | INTRAMUSCULAR | Status: DC | PRN

## 2012-08-18 MED ORDER — TETANUS-DIPHTH-ACELL PERTUSSIS 5-2.5-18.5 LF-MCG/0.5 IM SUSP
0.5000 mL | Freq: Once | INTRAMUSCULAR | Status: DC
  Filled 2012-08-18: qty 0.5

## 2012-08-18 MED ORDER — SODIUM BICARBONATE 8.4 % IV SOLN
INTRAVENOUS | Status: AC
  Filled 2012-08-18: qty 50

## 2012-08-18 MED ORDER — FENTANYL 2.5 MCG/ML BUPIVACAINE 1/10 % EPIDURAL INFUSION (WH - ANES)
INTRAMUSCULAR | Status: AC
  Administered 2012-08-18: 14 mL/h via EPIDURAL
  Filled 2012-08-18: qty 125

## 2012-08-18 MED ORDER — LACTATED RINGERS IV SOLN
40.0000 [IU] | INTRAVENOUS | Status: DC | PRN
  Administered 2012-08-18: 40 [IU] via INTRAVENOUS

## 2012-08-18 MED ORDER — NALOXONE HCL 1 MG/ML IJ SOLN: 1.0000 ug/kg/h | INTRAVENOUS | Status: DC | PRN

## 2012-08-18 MED ORDER — SIMETHICONE 80 MG PO CHEW
80.0000 mg | CHEWABLE_TABLET | ORAL | Status: DC | PRN
  Administered 2012-08-19: 80 mg via ORAL

## 2012-08-18 MED ORDER — LABETALOL HCL 5 MG/ML IV SOLN
20.0000 mg | INTRAVENOUS | Status: DC | PRN
  Administered 2012-08-18: 20 mg via INTRAVENOUS

## 2012-08-18 MED ORDER — TERBUTALINE SULFATE 1 MG/ML IJ SOLN
0.2500 mg | Freq: Once | INTRAMUSCULAR | Status: AC
  Administered 2012-08-18: 0.25 mg via SUBCUTANEOUS

## 2012-08-18 MED ORDER — ACETAMINOPHEN 10 MG/ML IV SOLN: 1000.0000 mg | Freq: Once | INTRAVENOUS | Status: DC | PRN

## 2012-08-18 MED ORDER — LABETALOL HCL 5 MG/ML IV SOLN
INTRAVENOUS | Status: AC
  Filled 2012-08-18: qty 4

## 2012-08-18 MED ORDER — SODIUM CHLORIDE 0.9 % IV SOLN
2.0000 g | Freq: Four times a day (QID) | INTRAVENOUS | Status: DC
  Administered 2012-08-18: 2 g via INTRAVENOUS
  Filled 2012-08-18 (×3): qty 2000

## 2012-08-18 MED ORDER — MAGNESIUM SULFATE 50 % IJ SOLN: INTRAMUSCULAR | Status: DC | PRN

## 2012-08-18 MED ORDER — HYDRALAZINE HCL 20 MG/ML IJ SOLN
5.0000 mg | Freq: Once | INTRAMUSCULAR | Status: AC
  Administered 2012-08-18: 5 mg via INTRAVENOUS

## 2012-08-18 MED ORDER — ACETAMINOPHEN 325 MG PO TABS
325.0000 mg | ORAL_TABLET | ORAL | Status: DC | PRN
  Administered 2012-08-18: 325 mg via ORAL

## 2012-08-18 MED ORDER — ONDANSETRON HCL 4 MG/2ML IJ SOLN: 4.0000 mg | INTRAMUSCULAR | Status: DC | PRN

## 2012-08-18 MED ORDER — OXYTOCIN 40 UNITS IN LACTATED RINGERS INFUSION - SIMPLE MED: 62.5000 mL/h | INTRAVENOUS | Status: AC

## 2012-08-18 MED ORDER — MEASLES, MUMPS & RUBELLA VAC ~~LOC~~ INJ: 0.5000 mL | INJECTION | Freq: Once | SUBCUTANEOUS | Status: DC

## 2012-08-18 MED ORDER — DIBUCAINE 1 % RE OINT: 1.0000 "application " | TOPICAL_OINTMENT | RECTAL | Status: DC | PRN

## 2012-08-18 SURGICAL SUPPLY — 32 items
CLIP FILSHIE TUBAL LIGA STRL (Clip) ×2 IMPLANT
CLOTH BEACON ORANGE TIMEOUT ST (SAFETY) ×2 IMPLANT
DRAPE LG THREE QUARTER DISP (DRAPES) ×2 IMPLANT
DRSG OPSITE 11X17.75 LRG (GAUZE/BANDAGES/DRESSINGS) ×2 IMPLANT
DRSG OPSITE POSTOP 4X10 (GAUZE/BANDAGES/DRESSINGS) IMPLANT
DURAPREP 26ML APPLICATOR (WOUND CARE) IMPLANT
ELECT REM PT RETURN 9FT ADLT (ELECTROSURGICAL) ×2
ELECTRODE REM PT RTRN 9FT ADLT (ELECTROSURGICAL) ×1 IMPLANT
EXTRACTOR VACUUM M CUP 4 TUBE (SUCTIONS) IMPLANT
GLOVE BIOGEL PI IND STRL 7.0 (GLOVE) ×4 IMPLANT
GLOVE BIOGEL PI INDICATOR 7.0 (GLOVE) ×4
GLOVE ECLIPSE 7.0 STRL STRAW (GLOVE) IMPLANT
GOWN PREVENTION PLUS XLARGE (GOWN DISPOSABLE) ×2 IMPLANT
GOWN STRL REIN XL XLG (GOWN DISPOSABLE) ×6 IMPLANT
KIT ABG SYR 3ML LUER SLIP (SYRINGE) ×2 IMPLANT
NEEDLE HYPO 22GX1.5 SAFETY (NEEDLE) ×2 IMPLANT
NEEDLE HYPO 25X5/8 SAFETYGLIDE (NEEDLE) ×2 IMPLANT
NS IRRIG 1000ML POUR BTL (IV SOLUTION) ×2 IMPLANT
PACK C SECTION WH (CUSTOM PROCEDURE TRAY) ×2 IMPLANT
PAD ABD 7.5X8 STRL (GAUZE/BANDAGES/DRESSINGS) ×2 IMPLANT
PAD OB MATERNITY 4.3X12.25 (PERSONAL CARE ITEMS) ×2 IMPLANT
RTRCTR C-SECT PINK 25CM LRG (MISCELLANEOUS) ×2 IMPLANT
SLEEVE SCD COMPRESS KNEE MED (MISCELLANEOUS) IMPLANT
STAPLER VISISTAT 35W (STAPLE) IMPLANT
SUT VIC AB 0 CTX 36 (SUTURE) ×4
SUT VIC AB 0 CTX36XBRD ANBCTRL (SUTURE) ×4 IMPLANT
SUT VIC AB 4-0 KS 27 (SUTURE) IMPLANT
SYR 30ML LL (SYRINGE) IMPLANT
TAPE CLOTH SURG 4X10 WHT LF (GAUZE/BANDAGES/DRESSINGS) ×2 IMPLANT
TOWEL OR 17X24 6PK STRL BLUE (TOWEL DISPOSABLE) ×6 IMPLANT
TRAY FOLEY CATH 14FR (SET/KITS/TRAYS/PACK) IMPLANT
WATER STERILE IRR 1000ML POUR (IV SOLUTION) ×2 IMPLANT

## 2012-08-18 NOTE — Op Note (Addendum)
Preoperative Diagnosis:  IUP @ [redacted]w[redacted]d, Prolonged ROM, chorioamnionitis, non-reassuring FHR, morbid obesity (BMI 51), undesired fertility  Postoperative Diagnosis:  Same  Procedure: Repeat low transverse cesarean section, Bilateral Tubal Ligation with Filshie clips  Surgeon: Tinnie Gens, M.D.  Assistant: None  Anesthesia: spinal with Gaylan Gerold, MD  Findings: Viable female infant, APGAR (1 MIN): 1   APGAR (5 MINS): 5   APGAR (10 MINS): 8   Nuchal cord x 2 with shoulder cord, thin umbilical cord.  Profound odor noted on entry into the uterine cavity. Fibroid uterus, Normal tubes and ovaries Arterial Cord pH 7.18  Estimated blood loss: 1200 cc  Complications: None known  Specimens: Placenta to pathology  Reason for procedure: Briefly, the patient is a 32 y.o. G1P1001 at [redacted]w[redacted]d with prolonged ROM, who was admitted, found to be pre-eclamptic, started on Magnesium Sulfate, and Pitocin.  Had a weird reaction that was an altered mental status, possibly to Stadol, which was not thought to be a seizure during labor.  She developed chorioamnionitis after essentially 36 hours of ROM.  She was started on Antibiotics and given tylenol.  Labor progressed to 7-8 cm with fever, moderate to severe repetitive variables, then a prolonged bradycardia which expedited delivery.  The patient desires permanent sterility.  Patient counseled, r.e. Risks benefits of BTL, including permanency of procedure, risk of failure(1:100), increased risk of ectopic.  Patient verbalized understanding and desires to proceed   Procedure: Patient is a to the OR where spinal analgesia was administered. She was then placed in a supine position with left lateral tilt. She received 3 g of Ancef and SCDs were in place. A Foley catheter was placed in the bladder. She was prepped and draped in the usual sterile fashion. A timeout was performed. A knife was then used to make a Pfannenstiel incision. This incision was carried out to  underlying fascia which was divided in the midline with the knife. The incision was extended laterally, sharply. The fascia was dissected of the underlying rectus superiorly.  The rectus was divided in the midline.  The peritoneal cavity was entered bluntly.  Alexis retractor was placed inside the incision.  A knife was used to make a low transverse incision on the uterus. This incision was carried down to the amniotic cavity was entered. Fetus was in LOA position and was brought up out of the incision without difficulty. Cord was clamped x 2 and cut. Infant taken to waiting pediatrician. Cord pH was obtained. Cord blood was obtained. Placenta was delivered from the uterus.  Uterus was cleaned with dry lap pads. Uterine incision closed with 0 Vicryl suture in a locked running fashion. Attention was turned to the pt's left tube which was grasped with a Babcock clamp and followed to its fimbriated end.  A Filshie clip was placed across the tube 1.5 cm from the cornu.  Attention was turned to the pt's right tube which was grasped with a Babcock clamp and followed to its fimbriated end.  A Filshie clip was placed across the tube 1.5 cm from the cornu. A 1cc of 0.25% Marcaine was injected into the surrounding tubes bilaterally.  Alexis retractor was removed from the abdomen. Peritoneal closure was done with 0 Vicryl suture.   Fascia is closed with 0 Vicryl suture in a running fashion. Subcutaneous tissue infused with 30cc 0.25% Marcaine.  Subcutaneous closure was performed with 0 Plain suture.  Skin closed using 3-0 Vicryl on a Keith needle.  Steri strips applied, followed by  pressure dressing.  All instrument, needle and lap counts were correct x 2.  Patient was awake and taken to PACU stable.  Infant to Newborn Nursery, stable.

## 2012-08-18 NOTE — Progress Notes (Signed)
Joellyn Haff, CNM called and notified of pt shivering, last temp, vbls continue, position changes. Asked to evaluate in person. Coming to see pt.

## 2012-08-18 NOTE — Anesthesia Procedure Notes (Signed)
Epidural Patient location during procedure: OB Start time: 08/18/2012 5:39 AM  Staffing Performed by: anesthesiologist   Preanesthetic Checklist Completed: patient identified, site marked, surgical consent, pre-op evaluation, timeout performed, IV checked, risks and benefits discussed and monitors and equipment checked  Epidural Patient position: sitting Prep: site prepped and draped and DuraPrep Patient monitoring: continuous pulse ox and blood pressure Approach: midline Injection technique: LOR air  Needle:  Needle type: Tuohy  Needle gauge: 17 G Needle length: 9 cm and 9 Needle insertion depth: 9 cm Catheter type: closed end flexible Catheter size: 19 Gauge Catheter at skin depth: 15 cm Test dose: negative  Assessment Events: blood not aspirated, injection not painful, no injection resistance, negative IV test and no paresthesia  Additional Notes Discussed risk of headache, infection, bleeding, nerve injury and failed or incomplete block.  Patient voices understanding and wishes to proceed.  Epidural placed easily on first attempt.  No paresthesia.  Patient tolerated procedure well with no apparent complications.  Jasmine December, MD Reason for block:procedure for pain

## 2012-08-18 NOTE — Progress Notes (Signed)
Called to bedside emergently for pt unresponsiveness L&D Attending, anesthesia, and midwife also paged and came urgently to bedside.  At time of exam Pt is AAOx3, CN 2-12 intact, moves all extremities spontaneously, extremities edematous but strength is 2+ and symmetrical. Pt w/ slowed speech. Patellar reflexes present bilat.  Magnesium stopped and STAT labs sent (Mg CMET, CBC).  Magnesium level of 4.3 noted WHite count elevated to 16  Concern for stroke is our primary concern as Mg level not in the toxic range and no evidence of stroke on the [facility] monitor.   Discussed case w/ Neuro on call and will send pt for CT head w/o contrast.   Shelly Flatten, MD Family Medicine PGY-2 08/18/2012, 4:03 AM

## 2012-08-18 NOTE — Anesthesia Postprocedure Evaluation (Signed)
Anesthesia Post Note  Patient: Rebekah Fowler  Procedure(s) Performed: Procedure(s) (LRB): Primary cesarean section with delivery of baby boy at 67. Apgars 1/5/8  Bilateral tubal ligation with filshie clips. (N/A)  Anesthesia type: Epidural  Patient location: PACU  Post pain: Pain level controlled  Post assessment: Post-op Vital signs reviewed  Last Vitals: BP 131/59  Pulse 112  Temp(Src) 37.2 C (Axillary)  Resp 14  Ht 5\' 4"  (1.626 m)  Wt 302 lb (136.986 kg)  BMI 51.81 kg/m2  SpO2 97%  LMP 11/29/2011  Post vital signs: Reviewed  Level of consciousness: sedated  Complications: No apparent anesthesia complications

## 2012-08-18 NOTE — Consult Note (Signed)
Reason for Consult: Altered Metnal Status Referring Physician: Harraway-Smith, C  CC: Slowed speech  History is obtained from:PAtient's family, referring MD  HPI: Rebekah Fowler is a 32 y.o. female undergoing induced delivery after rupture of membranes. She had an episode of emesis followed by "shivering." Family describes it looking like "she was cold." It lasted for a few minutes. On RN arrival, she was talking, but with slowed speech and dysarthria. There are apparently some signs on the [facility] monitor that can be seen with GTC and these were not seen. She denies visual change or headache. She has been hypertensive.    ROS: A 14 point ROS was performed and is negative except as noted in the HPI.  Past Medical History  Diagnosis Date  . Blood type, Rh negative   . Hyperemesis arising during pregnancy   . Obesity   . Fibroid   . Pregnancy induced hypertension     Family History: No hx cva  Social History: Tob: none  Exam: Current vital signs: BP 138/110  Pulse 114  Temp(Src) 99 F (37.2 C) (Oral)  Resp 20  Ht 5\' 4"  (1.626 m)  Wt 136.986 kg (302 lb)  BMI 51.81 kg/m2  SpO2 100%  LMP 11/29/2011 Vital signs in last 24 hours: Temp:  [97.7 F (36.5 C)-99 F (37.2 C)] 99 F (37.2 C) (04/05 0133) Pulse Rate:  [62-129] 114 (04/05 0503) Resp:  [17-40] 20 (04/05 0400) BP: (109-196)/(66-110) 138/110 mmHg (04/05 0503) SpO2:  [97 %-100 %] 100 % (04/05 0438) Weight:  [136.986 kg (302 lb)] 136.986 kg (302 lb) (04/04 0719)  General: in bed, having contractions CV: RRR Mental Status: Patient is awake, alert, oriented to person, place, month, year, and situation.She keeps here eyes closed, but opens them on command.  She is able to follow commands and answer questions. She is able to identify family memebers. She initially is slightly slow to respond and dysarthric, but this clears by the time I left the patient.  Cranial Nerves: II: Visual Fields are full. Pupils are equal, round,  and reactive to light.  Discs are difficult to visualize III,IV, VI: EOMI without ptosis or diploplia.  V: Facial sensation is symmetric to temperature VII: Facial movement is symmetric.  VIII: hearing is intact to voice X: Uvula elevates symmetrically XI: Shoulder shrug is symmetric. XII: tongue is midline without atrophy or fasciculations.  Motor: Tone is normal. Bulk is normal. She moves all four extremities to command with good apparent strength, but limited due to repeated contractions during confrontational testing.  Sensory: Sensation is symmetric to light touch in the arms and legs. Deep Tendon Reflexes: 3+ and symmetric in the biceps and patellae. Cerebellar: FNF intact bilaterally Gait: Not tested due to active labor.   I have reviewed labs in epic and the results pertinent to this consultation are: Low albumin  I have reviewed the images obtained:CT head - no hemorrhage.   Impression: 32 yo F undergoing labor with episode of AMS and dysarthria in the setting of hypertension and hyperreflexia. It is certainly possible that this represented a seizure, and she is currently on a magnesium drip which is being increased as her level remains slightly below what is typically targeted. Also possible would be a conversion reaction, however this is a diagnosis of exclusion and at this time, seizure remains in the differential.   Recommendations: 1) Continue magnesium for convulsion prophylaxis titrated per OB 2) Continue supportive care per [facility] physicians.  3) Will continue to follow.  Roland Rack, MD [facility] [phone]  If 7pm- 7am, please page neurology on call at [phone].

## 2012-08-18 NOTE — Progress Notes (Signed)
Patient ID: Rebekah Fowler, female   DOB: 04-Jul-1980, 32 y.o.   MRN: [id number] Late entry note.  Pt. With persistent variables and one 6 min. Bradycardia.  Re-check at 1630 revealed pt. To be 7-8 cm.  There was still ok variability between variables.  However, after last check, she had a prolonged bradycardia  x 10 min. This failed to resolve with change in position, stopping Pitocin.  She was markedly febrile.  FHR in 150's.  Pt. Given subcutaneous terbutaline in the room and prepared for OR. C-section called and pt transferred to C-section suite.  The FHR was in the 125-130 range in the OR.

## 2012-08-18 NOTE — Progress Notes (Signed)
ANTIBIOTIC CONSULT NOTE - INITIAL  Pharmacy Consult for gentamicin Indication: r/o chorio  Allergies  Allergen Reactions  . Latex Itching and Rash    Patient Measurements: Height: 5\' 4"  (162.6 cm) Weight: 302 lb (136.986 kg) IBW/kg (Calculated) : 54.7 Adjusted Body Weight: 79 kg  Vital Signs: Temp: 99.4 F (37.4 C) (04/05 1042) Temp src: Axillary (04/05 1042) BP: 132/75 mmHg (04/05 1103) Pulse Rate: 127 (04/05 1103) Intake/Output from previous day: 04/04 0701 - 04/05 0700 In: 4298.8 [P.O.:1560; I.V.:2738.8] Out: 2250 [Urine:2250] Intake/Output from this shift: Total I/O In: 906 [P.O.:370; I.V.:536] Out: 295 [Urine:295]  Labs:  Recent Labs  08/17/12 0215 08/17/12 1618 08/18/12 0320  WBC 11.6* 13.2* 16.7*  HGB 9.8* 9.5* 10.2*  PLT 315 298 329  CREATININE  --  0.60 0.59    Medical History: Past Medical History  Diagnosis Date  . Blood type, Rh negative   . Hyperemesis arising during pregnancy   . Obesity   . Fibroid   . Pregnancy induced hypertension     Medications:  Ampicillin 2g IV q6h  Assessment: Pt is a 32 yo F being initiated on ampicillin and gentamicin for maternal temp/rule out chorio.   Goal of Therapy:  Gentamicin peak 6-8 mcg/mL and trough <2 mcg/mL  Plan:  Gentamicin 170mg  IV q8h   Jeane Cashatt [place] 08/18/2012,11:34 AM

## 2012-08-18 NOTE — Anesthesia Preprocedure Evaluation (Addendum)
Anesthesia Evaluation  Patient identified by MRN, date of birth, ID band Patient awake    Reviewed: Allergy & Precautions, H&P , NPO status , Patient's Chart, lab work & pertinent test results, reviewed documented beta blocker date and time   History of Anesthesia Complications Negative for: history of anesthetic complications  Airway Mallampati: II TM Distance: >3 FB Neck ROM: full    Dental  (+) Teeth Intact   Pulmonary neg pulmonary ROS,  breath sounds clear to auscultation        Cardiovascular hypertension (preeclampsia on magnesium), Rhythm:regular Rate:Normal     Neuro/Psych Episode earlier tonight of slurred speech, decreased LOC.  Magnesium level was normal, head CT was normal.  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  Patient is now speaking and mentating normally. negative psych ROS   GI/Hepatic negative GI ROS, Neg liver ROS,   Endo/Other  Morbid obesity  Renal/GU negative Renal ROS     Musculoskeletal   Abdominal   Peds  Hematology  (+) anemia ,   Anesthesia Other Findings   Reproductive/Obstetrics (+) Pregnancy                           Anesthesia Physical Anesthesia Plan  ASA: III and emergent  Anesthesia Plan: Epidural   Post-op Pain Management:    Induction:   Airway Management Planned: Nasal Cannula  Additional Equipment:   Intra-op Plan:   Post-operative Plan:   Informed Consent: I have reviewed the patients History and Physical, chart, labs and discussed the procedure including the risks, benefits and alternatives for the proposed anesthesia with the patient or authorized representative who has indicated his/her understanding and acceptance.   Dental advisory given  Plan Discussed with: CRNA  Anesthesia Plan Comments:        Anesthesia Quick Evaluation

## 2012-08-18 NOTE — Progress Notes (Addendum)
Responded to Rapid Response call - pt w/mental status change in a pre-eclamptic pt on magnesium (off at present). Attending, resident, CNM, [facility] & multiple L&D RN's already present. Stat labs processing. Receiving doses of hydralazine for elevated BP's. Anesthesia called to assess pt. Family relates pt vomiting & then upper body shaking just prior to the mental status change. Neuro consulted & CT ordered by OB MD.

## 2012-08-18 NOTE — Progress Notes (Signed)
Family called for assistance stating pt was shaking and not acting like herself. When assessing patient she was not shaking. She was alert but slow to speak. Speech was some what slurred and pt could not open her eyes much. Patient alert but lethargic. MD called to assess.

## 2012-08-18 NOTE — OR Nursing (Addendum)
Uterus massaged by S. Idonna Heeren Charity fundraiser. Two tubes of cord blood sent to lab. Foley catheter in place upon arrival to OR. Urine color-concentrated.  20cc of blood evacuated from uterus during uterine massage. Filshie clips applied to right and left fallopian tubes by Dr. Gildardo Griffes. Lot [id number]. Expiration date 02/2015. Manufacture-[employer].

## 2012-08-18 NOTE — Transfer of Care (Signed)
Immediate Anesthesia Transfer of Care Note  Patient: Rebekah Fowler  Procedure(s) Performed: Procedure(s): Primary cesarean section with delivery of baby boy at 76. Apgars 1/5/8 (N/A)  Patient Location: PACU  Anesthesia Type:Epidural  Level of Consciousness: awake, alert  and oriented  Airway & Oxygen Therapy: Patient Spontanous Breathing  Post-op Assessment: Report given to PACU RN and Post -op Vital signs reviewed and stable  Post vital signs: Reviewed and stable  Complications: No apparent anesthesia complications

## 2012-08-18 NOTE — Progress Notes (Signed)
Called to room to evaluate patient with slowed speech. Patient found lying in bed able to respond to questions but with slowed speech. Patient's family stated that patient voiced the need for emesis basin, started shaking arms as if was cold, followed by a slowed speech. Patient reports not being in pain other than pain associated with her contractions.  Past Medical History  Diagnosis Date  . Blood type, Rh negative   . Hyperemesis arising during pregnancy   . Obesity   . Fibroid   . Pregnancy induced hypertension    Past Surgical History  Procedure Laterality Date  . No past surgeries     Family History  Problem Relation Age of Onset  . Hypertension Mother   . Cancer Sister 53    breast  . Diabetes Father    History  Substance Use Topics  . Smoking status: Never Smoker   . Smokeless tobacco: Never Used  . Alcohol Use: No   Physical exam: General- oriented x 3, slowed speech Neuro- CN II-XII intact  SVE: 7-8/80/-2 EXT: 2+ DTR bilaterally  A/P 32 yo admitted with PPROM on magnesium sulfate secondary to preeclampsia - Neuro exam remains intact - rule out stroke vs eclamptic seizure although patient never had a post-ictal state - Magnesium sulfate restarted as level 4.3 - Patient now in active labor - category I tracing - will follow up head CT - F/U Neuro consult

## 2012-08-18 NOTE — Progress Notes (Addendum)
Brittanya Katrinka Blazing is a 32 y.o. G1P0 at [redacted]w[redacted]d admitted for PROM since 4/3 @ 2330  Subjective: Comfortable w/ epidural, no complaints other than shaking and teeth chattering Family supportive at bs  Objective: BP 132/75  Pulse 127  Temp(Src) 99.4 F (37.4 C) (Axillary)  Resp 22  Ht 5\' 4"  (1.626 m)  Wt 136.986 kg (302 lb)  BMI 51.81 kg/m2  SpO2 100%  LMP 11/29/2011 I/O last 3 completed shifts: In: 4470.5 [P.O.:1590; I.V.:2880.5] Out: 2250 [Urine:2250] Total I/O In: 672 [P.O.:270; I.V.:402] Out: 245 [Urine:245] Urine output: ~70cc/hr per RN  FHT:  FHR: 155 bpm, variability: moderate,  accelerations:  Abscent,  decelerations:  Present recurrent variables to nadir of 90's w/ amnioinfusion UC:   regular, every 2-3 minutes, MVUs ~190 SVE:   5/80/-1, IFSE dislodged when changing maternal position for exam, new IFSE placed w/o difficulty  Labs: Lab Results  Component Value Date   WBC 16.7* 08/18/2012   HGB 10.2* 08/18/2012   HCT 31.0* 08/18/2012   MCV 78.1 08/18/2012   PLT 329 08/18/2012    Assessment / Plan: IOL d/t PROM, pitocin currently at 12mu/min with amnioinfusion at 166ml/hr w/ recurrent variables Essentially no cervical change since 0100 PROM x 36hrs, now w/ presumed chorioamnionitis  Labor: essentially no cervical change in 10hrs, MVUs mostly adequate since IUPC placed at 2200 last night Preeclampsia:  on magnesium sulfate and no signs or symptoms of toxicity Fetal Wellbeing:  Category II Pain Control:  Epidural I/D:  will begin ampicillin and gentamicin per protocol for suspected chorioamnionitis, also will give tylenol for fever Anticipated MOD:  uncertain at this time Dr. Shawnie Pons notified of pt status, will come evaluate soon  Marge Duncans 08/18/2012, 11:11 AM

## 2012-08-18 NOTE — Progress Notes (Signed)
Rapid response and house coverage called to assess patient. Both at bedside at [phone]

## 2012-08-18 NOTE — Progress Notes (Signed)
Orders to restart mag. 2gm

## 2012-08-18 NOTE — Progress Notes (Signed)
Rebekah Fowler is a 32 y.o. G1P0 at [redacted]w[redacted]d by ultrasound admitted for PROM, Pre-eclampsia  Subjective: Feels tired. No complaints  Objective: BP 160/65  Pulse 112  Temp(Src) 98.7 F (37.1 C) (Tympanic)  Resp 20  Ht 5\' 4"  (1.626 m)  Wt 302 lb (136.986 kg)  BMI 51.81 kg/m2  SpO2 100%  LMP 11/29/2011 I/O last 3 completed shifts: In: 4470.5 [P.O.:1590; I.V.:2880.5] Out: 2250 [Urine:2250] Total I/O In: 1299.4 [P.O.:620; I.V.:679.4] Out: 350 [Urine:350]  FHT:  FHR: 150 bpm, variability: moderate,  accelerations:  Abscent,  decelerations:  Present moderate variables UC:   regular, every 2-3 minutes SVE:   Dilation: 5 Effacement (%): 80 Station: -1 Exam by:: dr Shawnie Pons  Most recent exam done 2 hours later shows 5-6/90-1, so some cervical change. Labs: Lab Results  Component Value Date   WBC 16.7* 08/18/2012   HGB 10.2* 08/18/2012   HCT 31.0* 08/18/2012   MCV 78.1 08/18/2012   PLT 329 08/18/2012    Assessment / Plan: Protracted latent phase Chorioamnionitis Labor: Slow progress in morbidly obese pt.  Labor is close to adequate, but not yet 6 cm. Preeclampsia:  on magnesium sulfate Fetal Wellbeing:  Category II Pain Control:  Epidural I/D:  n/a Anticipated MOD:  Unsure at present. Continue Antibiotics.  PRATT,TANYA S 08/18/2012, 2:26 PM

## 2012-08-18 NOTE — Progress Notes (Signed)
Dr. Kirkpatrick at bedside 

## 2012-08-19 ENCOUNTER — Encounter (HOSPITAL_COMMUNITY): Payer: Self-pay | Admitting: Anesthesiology

## 2012-08-19 LAB — CBC
MCV: 78.5 fL (ref 78.0–100.0)
Platelets: 264 10*3/uL (ref 150–400)
RBC: 3.07 MIL/uL — ABNORMAL LOW (ref 3.87–5.11)
RDW: 14.7 % (ref 11.5–15.5)
WBC: 22.3 10*3/uL — ABNORMAL HIGH (ref 4.0–10.5)

## 2012-08-19 MED ORDER — DEXTROSE 5 % IV SOLN
2.0000 g | Freq: Once | INTRAVENOUS | Status: AC
  Administered 2012-08-19: 2 g via INTRAVENOUS
  Filled 2012-08-19: qty 2

## 2012-08-19 MED ORDER — LACTATED RINGERS IV SOLN: INTRAVENOUS | Status: DC

## 2012-08-19 MED ORDER — FUROSEMIDE 10 MG/ML IJ SOLN
20.0000 mg | Freq: Once | INTRAMUSCULAR | Status: AC
  Administered 2012-08-19: 20 mg via INTRAVENOUS
  Filled 2012-08-19: qty 2

## 2012-08-19 MED ORDER — FERROUS SULFATE 325 (65 FE) MG PO TABS
325.0000 mg | ORAL_TABLET | Freq: Three times a day (TID) | ORAL | Status: DC
  Administered 2012-08-19 – 2012-08-21 (×6): 325 mg via ORAL
  Filled 2012-08-19 (×6): qty 1

## 2012-08-19 MED ORDER — PANTOPRAZOLE SODIUM 40 MG PO TBEC
40.0000 mg | DELAYED_RELEASE_TABLET | Freq: Every day | ORAL | Status: DC
  Administered 2012-08-19 – 2012-08-21 (×3): 40 mg via ORAL
  Filled 2012-08-19 (×4): qty 1

## 2012-08-19 MED ORDER — RHO D IMMUNE GLOBULIN 1500 UNIT/2ML IJ SOLN
300.0000 ug | Freq: Once | INTRAMUSCULAR | Status: AC
  Administered 2012-08-19: 300 ug via INTRAVENOUS
  Filled 2012-08-19: qty 2

## 2012-08-19 MED ORDER — LACTATED RINGERS IV SOLN
INTRAVENOUS | Status: DC
  Administered 2012-08-19: 03:00:00 via INTRAVENOUS

## 2012-08-19 MED ORDER — DOCUSATE SODIUM 100 MG PO CAPS: 100.0000 mg | ORAL_CAPSULE | Freq: Two times a day (BID) | ORAL | Status: DC | PRN

## 2012-08-19 NOTE — Anesthesia Postprocedure Evaluation (Signed)
  Anesthesia Post-op Note  Patient: Rebekah Fowler  Procedure(s) Performed: Procedure(s): Primary cesarean section with delivery of baby boy at 46. Apgars 1/5/8  Bilateral tubal ligation with filshie clips. (N/A)  Patient Location: PACU and A-ICU  Anesthesia Type:Epidural  Level of Consciousness: awake, alert , oriented and patient cooperative  Airway and Oxygen Therapy: Patient Spontanous Breathing  Post-op Pain: mild  Post-op Assessment: Patient's Cardiovascular Status Stable, Respiratory Function Stable, Patent Airway, No signs of Nausea or vomiting, Adequate PO intake and Pain level controlled  Post-op Vital Signs: Reviewed and stable  Complications: No apparent anesthesia complications

## 2012-08-19 NOTE — Progress Notes (Signed)
Subjective: Postpartum Day 1: PLTCS/BTL for NRFHT in the setting of chorioamnionitis, prolonged ROM and preeclampsia Patient reports incisional pain and tolerating PO.  No flatus yet.  Minimal ambulation, SCDs on. Denies headache, vision changes, RUQ pain.  Breastfeeding, baby doing well in NICU  Objective: Vital signs in last 24 hours: Temp:  [97.3 F (36.3 C)-102 F (38.9 C)] 97.3 F (36.3 C) (04/06 0800) Pulse Rate:  [87-135] 92 (04/06 0600) Resp:  [14-24] 18 (04/06 0800) BP: (105-163)/(44-92) 122/70 mmHg (04/06 0600) SpO2:  [95 %-100 %] 98 % (04/06 0600) I/O last 3 completed shifts: In: 8255.6 [P.O.:1970; I.V.:6235.6; IV Piggyback:50] Out: 4240 [Urine:3390; Blood:850]  Physical Exam:  General: alert and no distress Lungs: CTAB Heart: RRR Lochia: appropriate Uterine Fundus: firm Incision: no significant drainage, dressing in place Extremities: 2+DTRs, no clonus.  No evidence of DVT seen on physical exam. Negative Homan's sign. No cords or calf tenderness.  Results for orders placed during the hospital encounter of 08/17/12 (from the past 24 hour(s))  PREPARE RBC (CROSSMATCH)     Status: None   Collection Time    08/18/12  1:00 PM      Result Value Range   Order Confirmation ORDER PROCESSED BY BLOOD BANK    CBC     Status: Abnormal   Collection Time    08/18/12  7:17 PM      Result Value Range   WBC 17.5 (*) 4.0 - 10.5 K/uL   RBC 3.31 (*) 3.87 - 5.11 MIL/uL   Hemoglobin 8.6 (*) 12.0 - 15.0 g/dL   HCT 16.1 (*) 09.6 - 04.5 %   MCV 77.9 (*) 78.0 - 100.0 fL   MCH 26.0  26.0 - 34.0 pg   MCHC 33.3  30.0 - 36.0 g/dL   RDW 40.9  81.1 - 91.4 %   Platelets 265  150 - 400 K/uL  CBC     Status: Abnormal   Collection Time    08/19/12  6:13 AM      Result Value Range   WBC 22.3 (*) 4.0 - 10.5 K/uL   RBC 3.07 (*) 3.87 - 5.11 MIL/uL   Hemoglobin 8.0 (*) 12.0 - 15.0 g/dL   HCT 78.2 (*) 95.6 - 21.3 %   MCV 78.5  78.0 - 100.0 fL   MCH 26.1  26.0 - 34.0 pg   MCHC 33.2  30.0 -  36.0 g/dL   RDW 08.6  57.8 - 46.9 %   Platelets 264  150 - 400 K/uL   Assessment/Plan: Status post Cesarean section and BTL, also has preeclampsia. No ongoing fevers or signs of persistent infection.  Doing well postoperatively.  Continue Magnesium sulfate for 24 hours post delivery. Ferrous sulfafe ordered for anemia, patient is asymptomatic Transfer to floor after magnesium sulfate as per protocol Routine postpartum care.  Rebekah Fowler A 08/19/2012, 9:15 AM

## 2012-08-19 NOTE — Progress Notes (Signed)
Pt pumping-BP cuff off

## 2012-08-20 ENCOUNTER — Encounter (HOSPITAL_COMMUNITY): Payer: Self-pay | Admitting: Family Medicine

## 2012-08-20 LAB — RH IG WORKUP (INCLUDES ABO/RH)
ABO/RH(D): O NEG
Antibody Screen: NEGATIVE
Fetal Screen: NEGATIVE
Gestational Age(Wks): 38

## 2012-08-20 LAB — CREATININE CLEARANCE, URINE, 24 HOUR
Creatinine, Urine: 99.6 mg/dL
Creatinine: 0.58 mg/dL (ref 0.50–1.10)

## 2012-08-20 LAB — PROTEIN, URINE, 24 HOUR: Protein, Urine: 21 mg/dL

## 2012-08-20 NOTE — Progress Notes (Signed)
Subjective: Postpartum Day 2: Cesarean Delivery Patient reports tolerating PO, ambulating, voiding, passing flatus, and minimal incisional/lower abdominal pain. Continues to pump to provide for infant who is in NICU.   Objective: Vital signs in last 24 hours: Temp:  [97.3 F (36.3 C)-98.7 F (37.1 C)] 98.7 F (37.1 C) (04/07 0644) Pulse Rate:  [66-100] 100 (04/07 0644) Resp:  [16-20] 19 (04/07 0644) BP: (113-136)/(47-76) 135/70 mmHg (04/07 0644) SpO2:  [95 %-100 %] 100 % (04/07 0644)  Physical Exam:  General: alert, cooperative and appears stated age Lochia: appropriate Uterine Fundus: Difficult to palpate secondary to body habitus Incision: healing well, no significant drainage, no dehiscence DVT Evaluation: No evidence of DVT seen on physical exam. Calf/Ankle edema is present bilat   Recent Labs  08/18/12 1917 08/19/12 0613  HGB 8.6* 8.0*  HCT 25.8* 24.1*    Assessment/Plan: Status post Cesarean section and BTL. POD2. Doing well postoperatively.  Status post MgSO4 >24hrs. No further neurological episodes.  Continue Ferrous sulfate for anemia.  Continue current care. Anticipate discharge in ~24hrs  Shai Rasmussen, MD Family Medicine Resident PGY-2 08/20/2012, 7:30 AM

## 2012-08-20 NOTE — Progress Notes (Signed)
Ur chart review completed.  

## 2012-08-21 LAB — TYPE AND SCREEN
ABO/RH(D): O NEG
Antibody Screen: NEGATIVE
Unit division: 0

## 2012-08-21 MED ORDER — HYDROCHLOROTHIAZIDE 25 MG PO TABS
25.0000 mg | ORAL_TABLET | Freq: Every day | ORAL | Status: DC
  Administered 2012-08-21: 25 mg via ORAL
  Filled 2012-08-21 (×2): qty 1

## 2012-08-21 MED ORDER — IBUPROFEN 600 MG PO TABS: 600.0000 mg | ORAL_TABLET | Freq: Four times a day (QID) | ORAL | Status: AC

## 2012-08-21 MED ORDER — HYDROCHLOROTHIAZIDE 25 MG PO TABS
12.5000 mg | ORAL_TABLET | Freq: Every day | ORAL | Status: DC
  Filled 2012-08-21: qty 0.5

## 2012-08-21 MED ORDER — DSS 100 MG PO CAPS: 100.0000 mg | ORAL_CAPSULE | Freq: Two times a day (BID) | ORAL | Status: AC | PRN

## 2012-08-21 MED ORDER — OXYCODONE-ACETAMINOPHEN 5-325 MG PO TABS: 1.0000 | ORAL_TABLET | ORAL | Status: AC | PRN

## 2012-08-21 MED ORDER — FERROUS SULFATE 325 (65 FE) MG PO TABS: 325.0000 mg | ORAL_TABLET | Freq: Three times a day (TID) | ORAL | Status: AC

## 2012-08-21 MED ORDER — HYDROCHLOROTHIAZIDE 25 MG PO TABS: 25.0000 mg | ORAL_TABLET | Freq: Every day | ORAL | Status: AC

## 2012-08-21 NOTE — Progress Notes (Signed)
Reported to [place], Midwife patients output total from 0930-1300 being 1100 ml. Discharge remains. Updated discharge instructions included a return in 1 week and HCTZ prescriptions that was sent to patients pharmacy on file.

## 2012-08-21 NOTE — Discharge Summary (Signed)
Obstetric Discharge Summary Rebekah Fowler is a 32 y.o. G1P1001 admitted at [redacted]w[redacted]d for ROM. She was induced with cytotec, foley bulb and pitocin. She began to have elevated blood pressures and was diagnosed with preeclampsia and started on magnesium. During labor, she had an episode of slowed speech and altered mental status. She was evaluated with CT head (normal) and by neurology. Differential includes drug reaction vs seizure vs conversion event. Patient had full recovery. She developed chorioamnionitis and was started on ampicillin and gentamycin. She was eventually taken for primary cesarean section for non-reassuring fetal heart tones. She underwent BTL at time of cesarean section for undesired fertility. Her postpartum course was unremarkable. She received continuous magnesium for 24 hours postpartum. Her blood pressures were in the 110s-120s/60s-70s postpartum with two in the 140s/70s on day of discharge.   On day of discharge she denies headache, vision change or RUQ pain. She does c/o of significant swelling of her lower extremities bilaterally and decreased urine output. She was started on HCTZ prior to discharge. She is pumping breast milk and plans to breastfeed once baby is able to nurse. Baby currently in NICU for sepsis workup.  Reason for Admission: rupture of membranes Prenatal Procedures: Preeclampsia Intrapartum Procedures: cesarean: low cervical, transverse, tubal ligation and amp/gent for chorio Postpartum Procedures: antibiotics Complications-Operative and Postpartum: none Hemoglobin  Date Value Range Status  08/19/2012 8.0* 12.0 - 15.0 g/dL Final     HCT  Date Value Range Status  08/19/2012 24.1* 36.0 - 46.0 % Final    Physical Exam:  General: alert, cooperative and no distress Lochia: appropriate Uterine Fundus: firm Incision: healing well, no significant drainage, no dehiscence, no significant erythema DVT Evaluation: No evidence of DVT seen on physical exam. Negative  Homan's sign. Calf/Ankle edema is present.  Discharge Diagnoses: Term Pregnancy-delivered, Amnionitis, PROM x66 hours and Preelampsia  Discharge Information: Date: 08/21/2012 Activity: pelvic rest Diet: routine Medications: PNV, Ibuprofen, Colace, Iron, Percocet and HCTZ 25 mg Condition: stable Instructions: refer to practice specific booklet Discharge to: home Follow-up Information   Follow up with [facility] [place] In 4 days. (For blood pressure check)    Contact information:   [address] [place] [address] [phone]      Follow up with [facility] at [place] In 6 weeks. (For postpartum check)    Contact information:   [address], [address] [place] [address] [phone]      Newborn Data: Live born female  Birth Weight: 5 lb 4.8 oz (2404 g) APGAR: 1, 5  Remains in  NICU.  Napoleon Form 08/21/2012, 7:27 AM

## 2012-08-21 NOTE — Discharge Summary (Signed)
Attestation of Attending Supervision of Advanced Practitioner (CNM/NP): Evaluation and management procedures were performed by the Advanced Practitioner under my supervision and collaboration.  I have reviewed the Advanced Practitioner's note and chart, and I agree with the management and plan.  Roshun Klingensmith 08/21/2012 10:17 AM

## 2012-08-21 NOTE — Progress Notes (Signed)
Patient discharged via wheelchair. Prescriptions with patient.

## 2012-08-21 NOTE — Progress Notes (Signed)
I saw and examined patient and agree with above resident note. I reviewed history, delivery summary, labs and vitals. BP controlled. Napoleon Form, MD

## 2012-08-24 ENCOUNTER — Encounter: Payer: Self-pay | Admitting: Family

## 2012-08-24 ENCOUNTER — Ambulatory Visit (INDEPENDENT_AMBULATORY_CARE_PROVIDER_SITE_OTHER): Payer: Medicaid Other | Admitting: Family

## 2012-08-24 ENCOUNTER — Encounter: Payer: Self-pay | Admitting: *Deleted

## 2012-08-24 VITALS — BP 149/92 | HR 91 | Temp 97.8°F | Resp 18 | Wt 297.0 lb

## 2012-08-24 DIAGNOSIS — Z09 Encounter for follow-up examination after completed treatment for conditions other than malignant neoplasm: Secondary | ICD-10-CM

## 2012-08-24 DIAGNOSIS — I10 Essential (primary) hypertension: Secondary | ICD-10-CM

## 2012-08-24 NOTE — Progress Notes (Signed)
  Subjective:    Patient ID: Rebekah Fowler, female    DOB: 1980/06/19, 32 y.o.   MRN: [id number]  HPI Ms. Katrinka Blazing is here for incision and blood pressure check s/p a csection on 08/18/12.  Labor was complicated with preeclampsia and chorioamnionitis.  Csection was performed due to fetal distress.  Postpartum course uncomplicated.  Pt sent home on HCTZ 25mg  for blood pressure.  No PIH symptoms today.  Reports bleeding has decreased and pain is minimal.  Baby is still in NICU for antibiotics and will be discharged home tomorrow.     Review of Systems See HPI    Objective:   Physical Exam  Constitutional: She is oriented to person, place, and time. She appears well-developed and well-nourished. No distress.  HENT:  Head: Normocephalic and atraumatic.  Neck: Normal range of motion. Neck supple. No thyromegaly present.  Cardiovascular: Normal rate, regular rhythm and normal heart sounds.   Pulmonary/Chest: Effort normal and breath sounds normal. No respiratory distress.  Abdominal: Soft. Bowel sounds are normal. There is no tenderness.  Incision with no signs of infection; healing extremely well; well-approximated.    Neurological: She is alert and oriented to person, place, and time.  Skin: Skin is warm and dry.          Assessment & Plan:  Hypertension Post-op check  Plan: Continue HCTZ Return in two weeks for BP check, may increase HCTZ to 50 mg PO Return for PIH symptoms or other postop complications

## 2012-09-07 ENCOUNTER — Encounter: Payer: Self-pay | Admitting: Advanced Practice Midwife

## 2012-09-07 ENCOUNTER — Ambulatory Visit (INDEPENDENT_AMBULATORY_CARE_PROVIDER_SITE_OTHER): Payer: Medicaid Other | Admitting: Advanced Practice Midwife

## 2012-09-07 DIAGNOSIS — O165 Unspecified maternal hypertension, complicating the puerperium: Secondary | ICD-10-CM

## 2012-09-07 NOTE — Progress Notes (Signed)
  Subjective:    Patient ID: Rebekah Fowler, female    DOB: 1981/03/10, 32 y.o.   MRN: [id number]  HPI Doing well 3 weeks after delivery  No headache or swelling Here for BP check and incision check   Review of Systems  Constitutional: Negative for fever, diaphoresis and appetite change.  Respiratory: Negative for shortness of breath.   Genitourinary: Negative for difficulty urinating.   No headache or vision changes    Objective:   Physical Exam  Constitutional: She is oriented to person, place, and time. She appears well-developed and well-nourished. No distress.  HENT:  Head: Normocephalic.  Cardiovascular: Normal rate.   Pulmonary/Chest: Effort normal.  Abdominal: Soft. She exhibits no distension. There is no tenderness. There is no rebound.  Musculoskeletal: Normal range of motion. She exhibits no edema.  Neurological: She is alert and oriented to person, place, and time.  Skin: Skin is warm and dry.  Psychiatric: She has a normal mood and affect.   Filed Vitals:   09/07/12 1050  BP: 128/93  Pulse: 96  Temp: 97.5 F (36.4 C)  Resp: 18  Incision well healed No erethema or drainage         Assessment & Plan:  A:  3 weeks PostPartum       Well healed incision       Mildly elevated postpartum BP  P:  Discussed with Dr Erin Fulling      Continue HCTZ but no need to increase dose      Followup in 3 wks

## 2012-09-17 ENCOUNTER — Encounter: Payer: Self-pay | Admitting: *Deleted

## 2012-09-28 ENCOUNTER — Ambulatory Visit (INDEPENDENT_AMBULATORY_CARE_PROVIDER_SITE_OTHER): Payer: Medicaid Other | Admitting: Advanced Practice Midwife

## 2012-09-28 VITALS — BP 112/72 | HR 92 | Resp 16 | Ht 64.0 in | Wt 258.0 lb

## 2012-09-28 DIAGNOSIS — O411090 Infection of amniotic sac and membranes, unspecified, unspecified trimester, not applicable or unspecified: Secondary | ICD-10-CM

## 2012-09-28 DIAGNOSIS — O1493 Unspecified pre-eclampsia, third trimester: Secondary | ICD-10-CM

## 2012-09-28 DIAGNOSIS — Z98891 History of uterine scar from previous surgery: Secondary | ICD-10-CM

## 2012-09-28 DIAGNOSIS — IMO0002 Reserved for concepts with insufficient information to code with codable children: Secondary | ICD-10-CM

## 2012-09-28 DIAGNOSIS — O411230 Chorioamnionitis, third trimester, not applicable or unspecified: Secondary | ICD-10-CM

## 2012-09-30 DIAGNOSIS — O411230 Chorioamnionitis, third trimester, not applicable or unspecified: Secondary | ICD-10-CM | POA: Insufficient documentation

## 2012-09-30 DIAGNOSIS — O1490 Unspecified pre-eclampsia, unspecified trimester: Secondary | ICD-10-CM | POA: Insufficient documentation

## 2012-09-30 NOTE — Progress Notes (Signed)
  Subjective:    Patient ID: Rebekah Fowler, female    DOB: 02-Oct-1980, 31 y.o.   MRN: [id number]  HPI This is a 32 y.o. female who is s/p Cesarean Delivery on 08/18/12. She was seen 3 wks ago for evaluation of hypertension and has been on HCTZ. Doing well. No headache or blurred vision.  Feels better.  No swelling.    Review of Systems  Constitutional: Negative for activity change.  HENT: Negative for facial swelling.   Cardiovascular: Negative for leg swelling.  Genitourinary: Negative for vaginal bleeding.  Neurological: Negative for dizziness, light-headedness and headaches.       Objective:   Physical Exam  Constitutional: She is oriented to person, place, and time. She appears well-developed and well-nourished. No distress.  HENT:  Head: Normocephalic.  Cardiovascular: Normal rate.   Pulmonary/Chest: Effort normal.  Abdominal: Soft. There is no tenderness. There is no rebound.  Well healed incision  Musculoskeletal: Normal range of motion. She exhibits no edema.  Neurological: She is alert and oriented to person, place, and time. She displays normal reflexes.  Skin: Skin is warm and dry.  Psychiatric: She has a normal mood and affect.   Filed Vitals:   09/28/12 0946  BP: 112/72  Pulse: 92  Resp: 16          Assessment & Plan:  A:  Post Op Cesarean Section      Resolved Preeclampsia  P:  Will stop HCTZ.       Recommend she get her BP checked periodically each year      Followup when time for annual exam

## 2012-10-04 ENCOUNTER — Encounter: Payer: Self-pay | Admitting: *Deleted

## 2014-03-17 ENCOUNTER — Encounter: Payer: Self-pay | Admitting: Advanced Practice Midwife

## 2014-03-22 IMAGING — US US OB LIMITED
1 series · 14 of 24 positions shown · non-contrast
Comparison: none

CLINICAL DATA: Upper abdominal pain.

[Series 1: us ob limited · 0.30mm/px · 24 acquisitions, 14 frames shown]
[im 1/24]
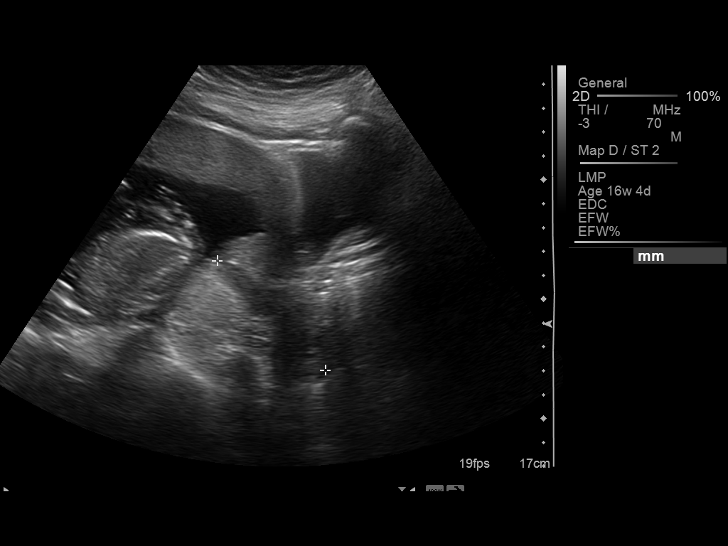
[im 3/24]
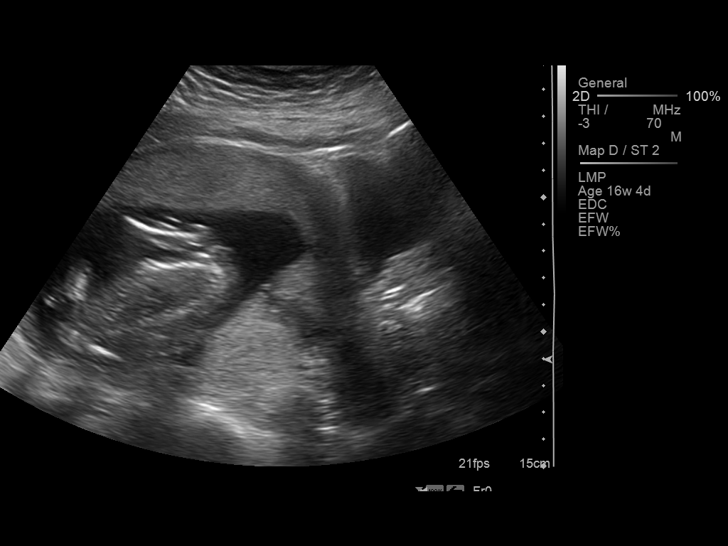
[im 5/24]
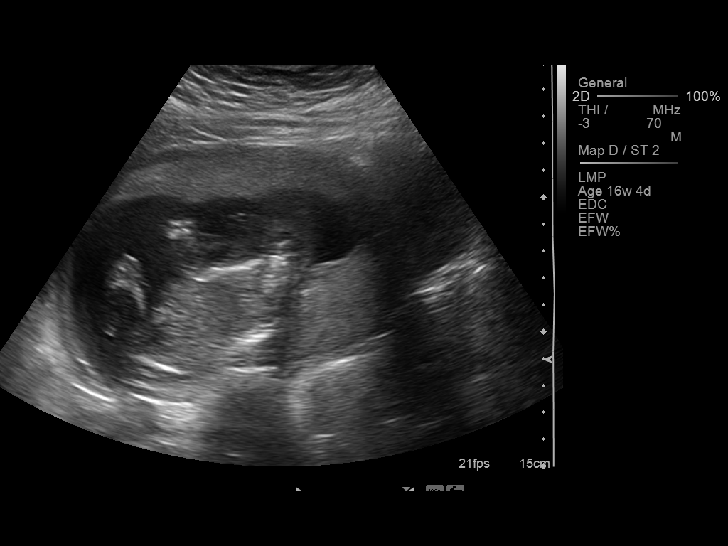
[im 7/24]
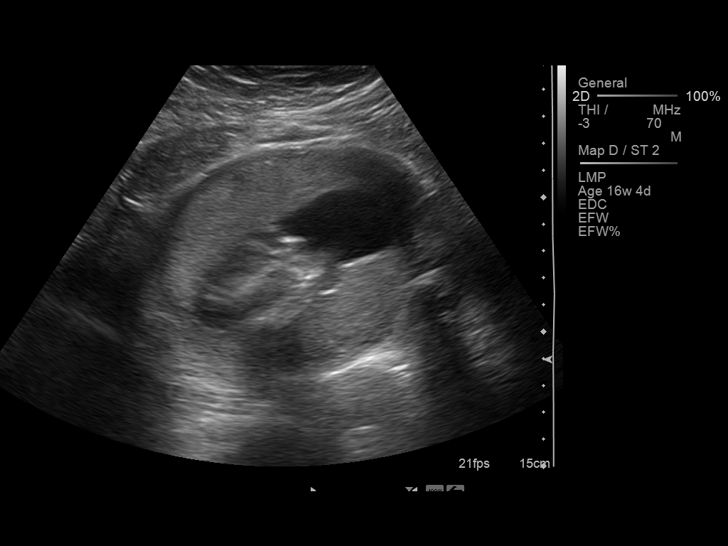
[im 8/24]
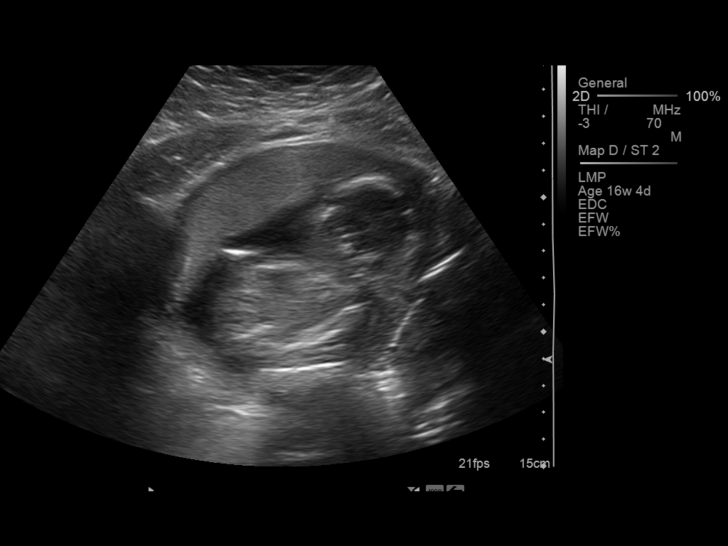
[im 10/24]
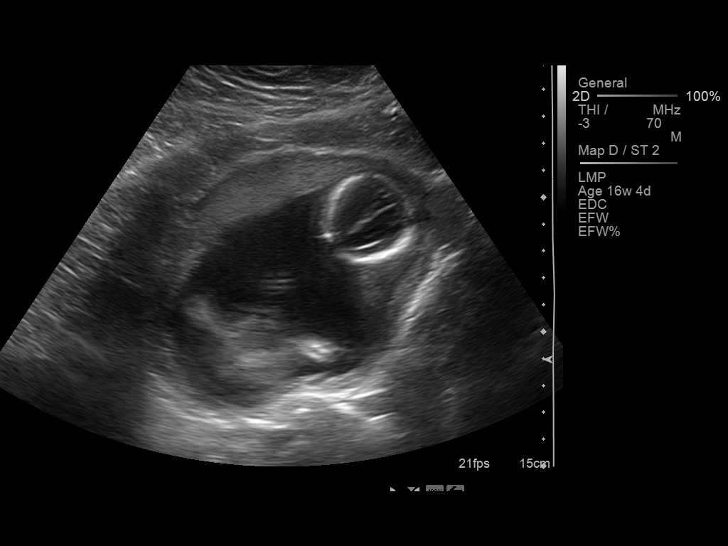
[im 12/24]
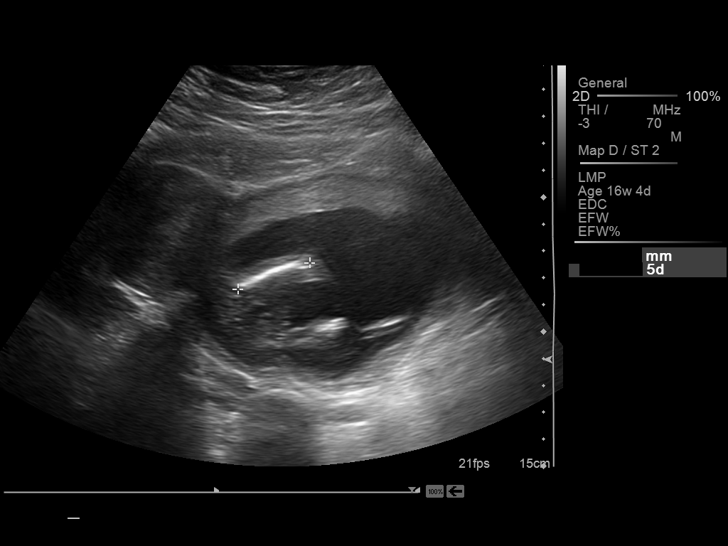
[im 13/24]
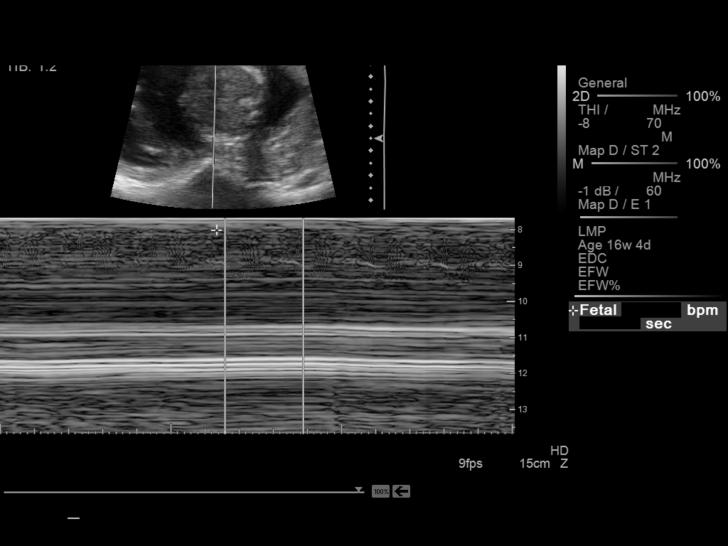
[im 15/24]
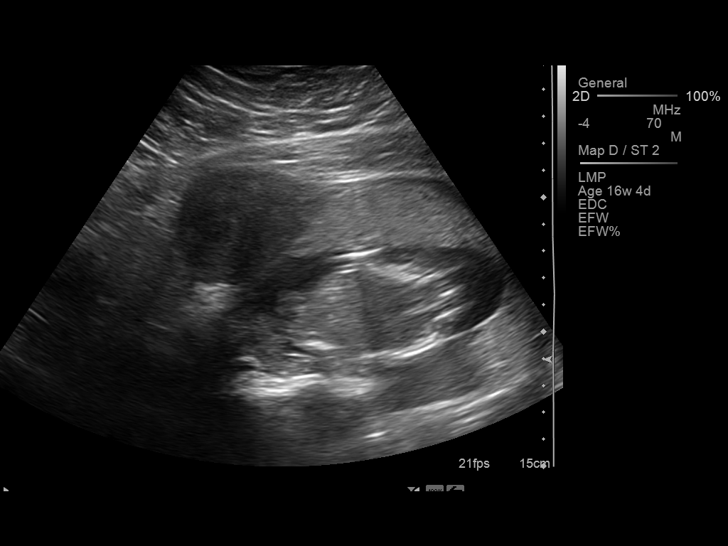
[im 17/24]
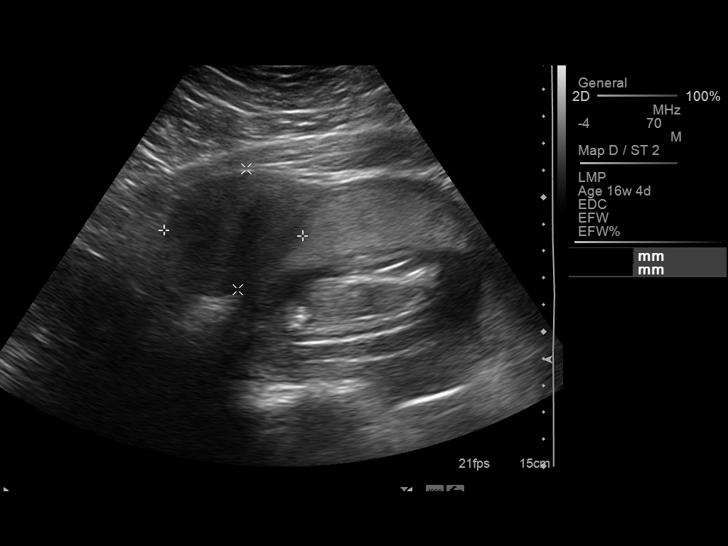
[im 19/24]
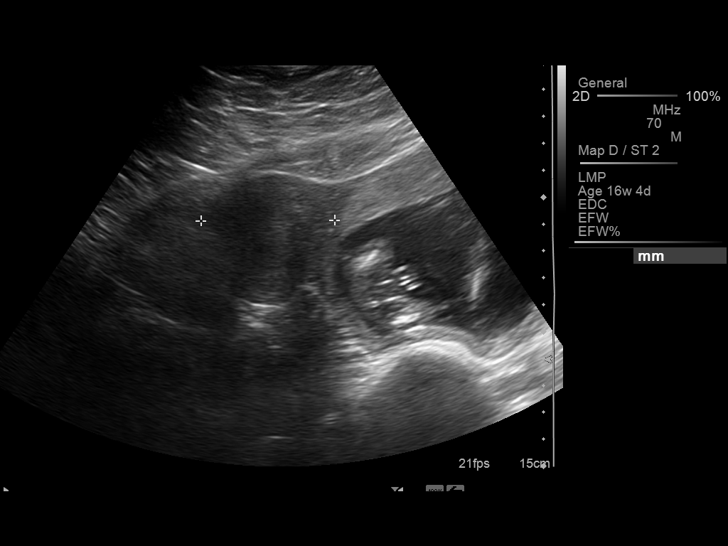
[im 20/24]
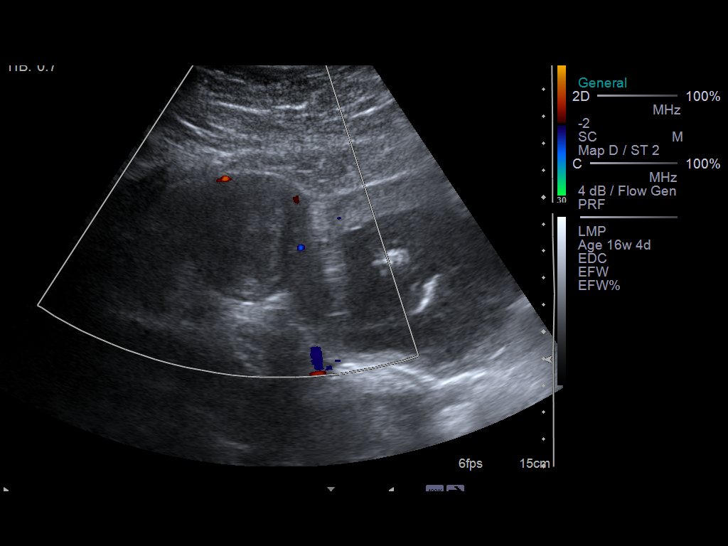
[im 22/24]
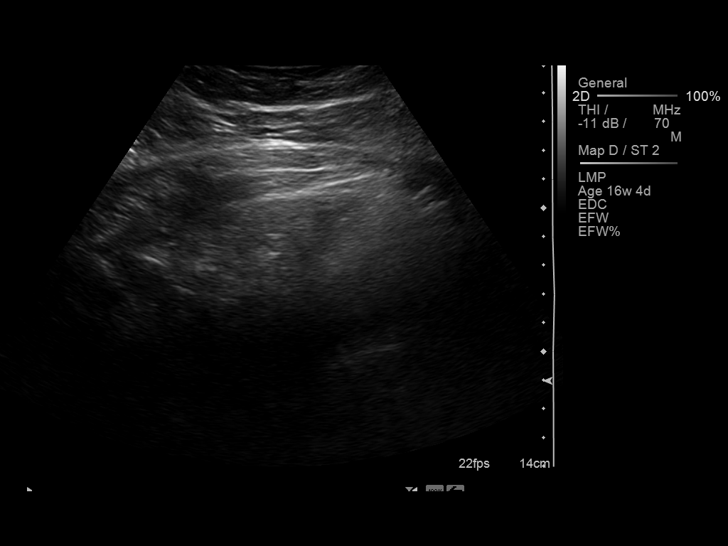
[im 24/24]
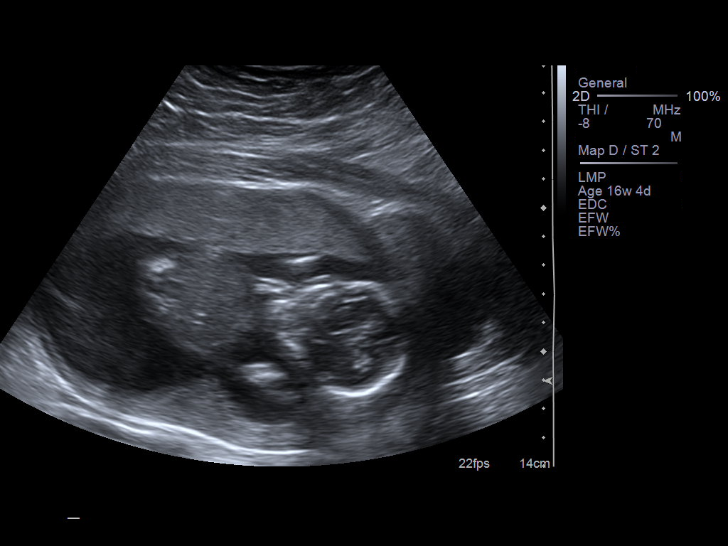

[14 of 24 positions shown; findings below may reference images not displayed]

LIMITED OBSTETRIC ULTRASOUND

Number of Fetuses: 1
Heart Rate: 131 bpm
Movement: Yes
Presentation: Transverse, head on maternal left
Placental Location: Anterior
Previa: No
Amniotic Fluid (Subjective): Normal

BPD: 3.9cm   18w   1d   EDC: 08/22/2012

MATERNAL FINDINGS:
Cervix: Closed
Uterus/Adnexae: A fibroid is noted at the uterine fundus, measuring
5.2 x 4.5 x 5.0 cm.  The adnexa are grossly unremarkable in
appearance.

No free fluid is seen within the pelvis.
IMPRESSION: 1.  Single live intrauterine pregnancy noted, with a biparietal
diameter of 3.9 cm, corresponding to a gestational age of 18 weeks
1 day.  This matches the gestational age of 16 weeks 4 days by LMP,
reflecting an estimated date of delivery September 04, 2012.
2.  5.2 cm fibroid noted at the uterine fundus.

Recommend followup with non-emergent complete OB 14+ wk US
examination for fetal biometric evaluation and anatomic survey if
not already performed.

## 2014-04-04 IMAGING — US US OB DETAIL+14 WK
2 series · 12 of 28 positions shown · non-contrast
Comparison: none

[Series 1: us ob detail +14 wk · 10 of 102 slices shown (1 of 2)]
[im 5/102]
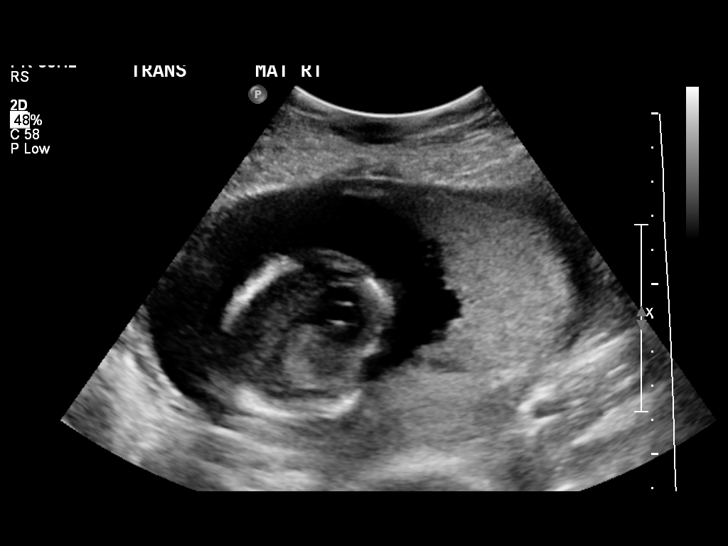
[im 14/102]
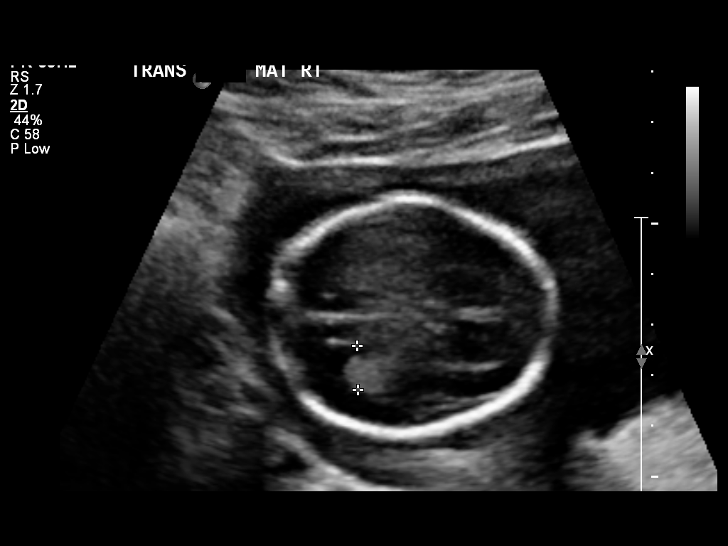
[im 22/102]
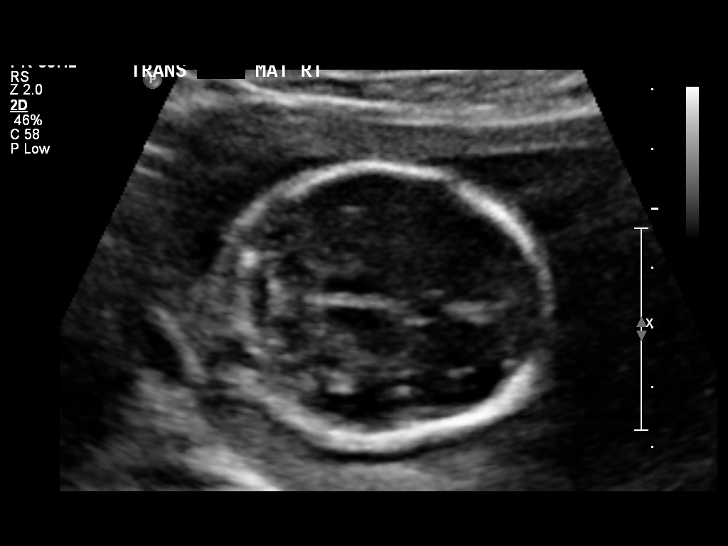
[im 36/102]
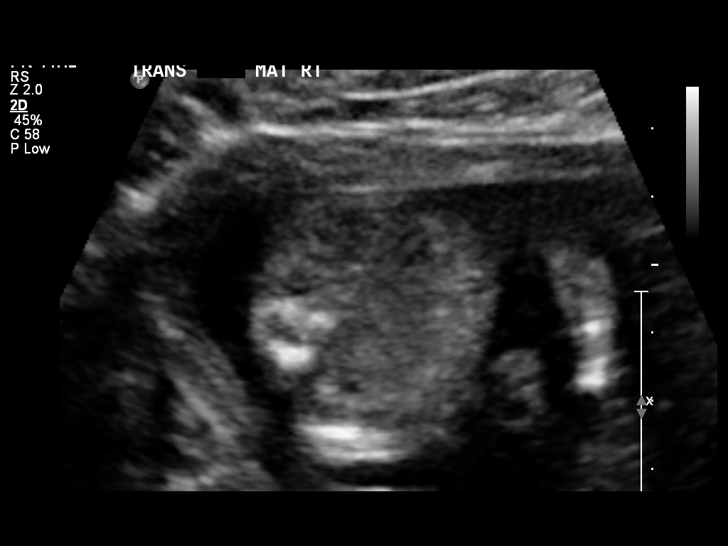
[im 44/102]
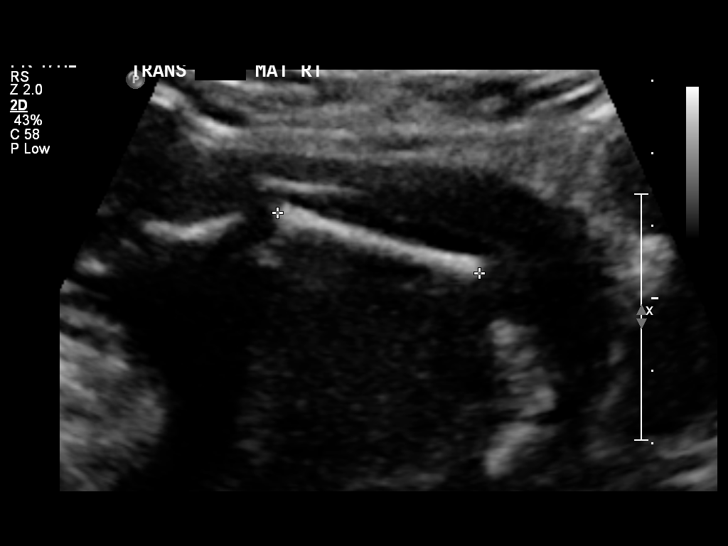
[im 53/102]
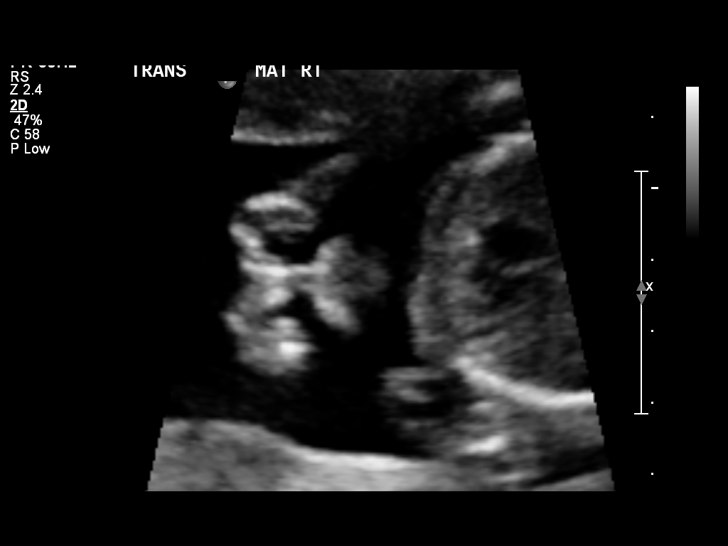
[im 66/102]
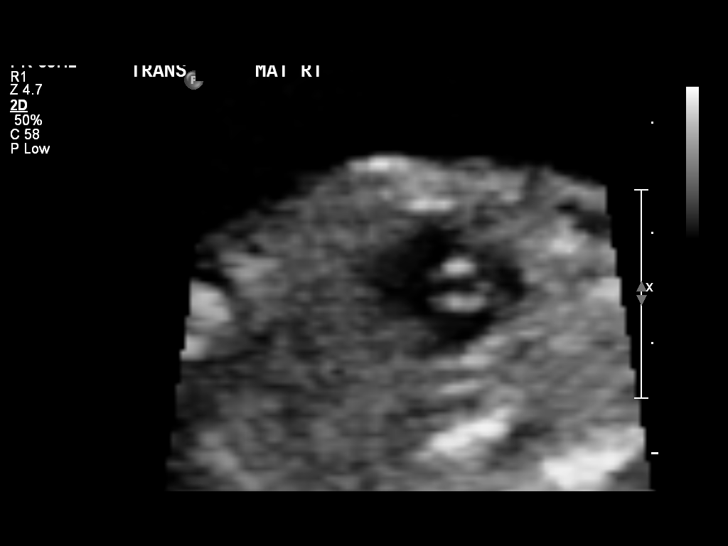
[im 75/102]
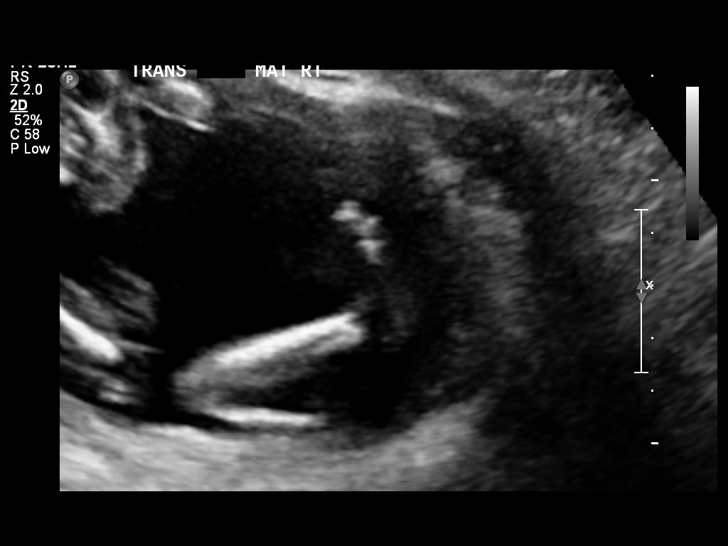
[im 84/102]
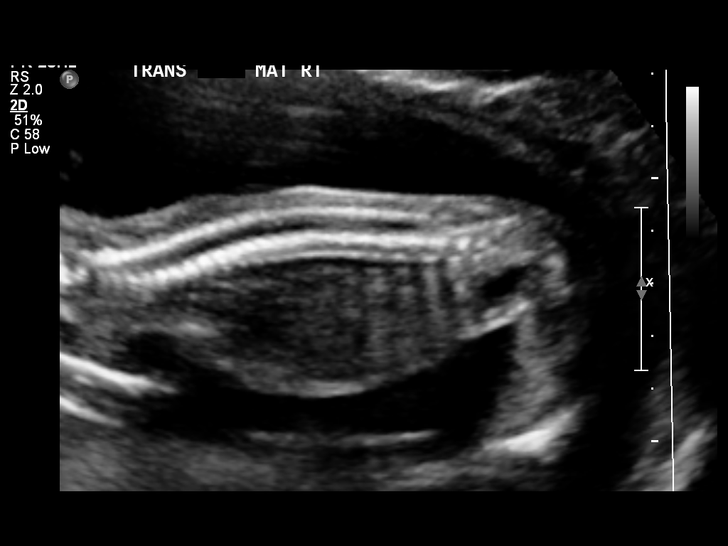
[im 97/102]
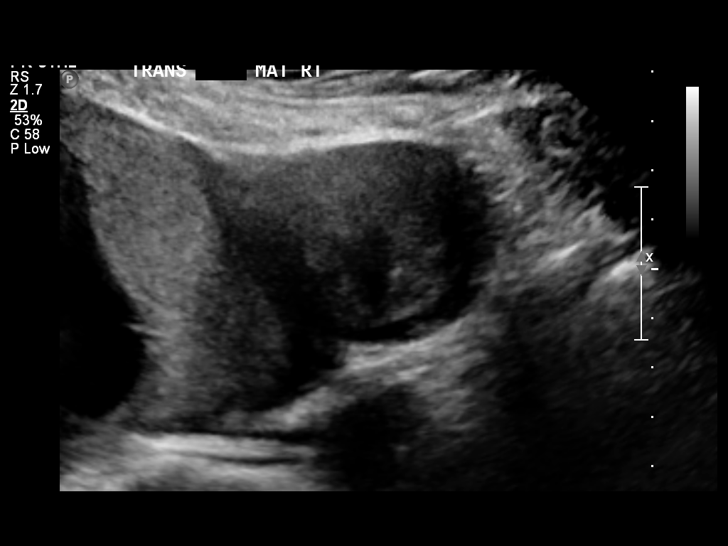

[Series 1: us ob detail +14 wk · 2 of 15 slices shown (2 of 2)]
[im 1/15]
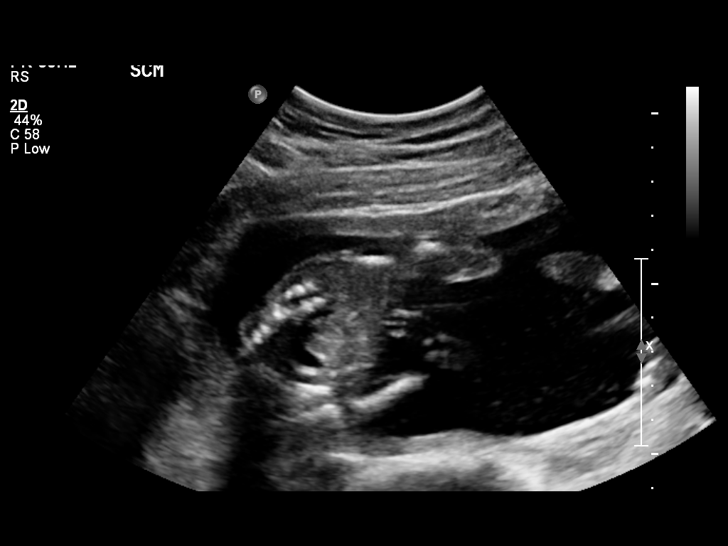
[im 10/15]
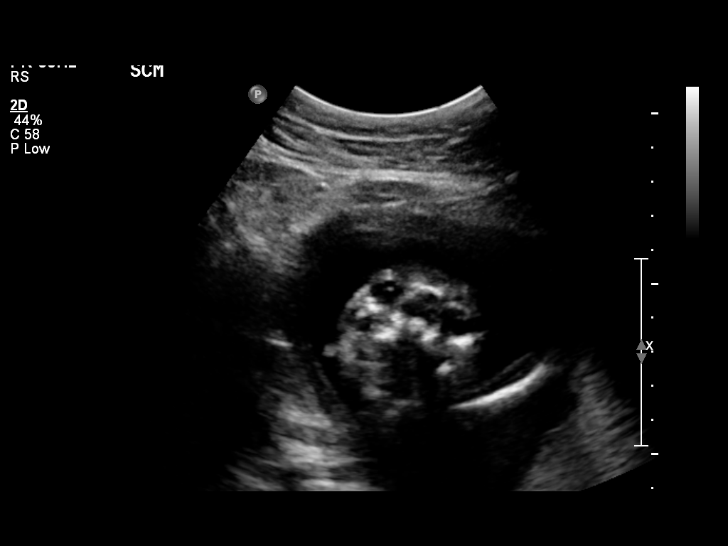

[12 of 28 positions shown; findings below may reference images not displayed]

OBSTETRICS REPORT
                      (Signed Final 04/06/2012 [DATE])

Service(s) Provided

 US OB DETAIL + 14 WK                                  76811.0
Indications

 Detailed fetal anatomic survey
Fetal Evaluation

 Num Of Fetuses:    1
 Fetal Heart Rate:  138                         bpm
 Cardiac Activity:  Observed
 Presentation:      Variable
 Placenta:          Left lateral, above cervical
                    os
 P. Cord            Visualized, central
 Insertion:

 Amniotic Fluid
 AFI FV:      Subjectively within normal limits
                                             Larg Pckt:     6.3  cm
Biometry

 BPD:     44.8  mm    G. Age:   19w 4d                CI:        72.02   70 - 86
                                                      FL/HC:      17.2   15.8 -
                                                                         18
 HC:       168  mm    G. Age:   19w 4d       86  %    HC/AC:      1.20   1.07 -

 AC:     139.5  mm    G. Age:   19w 2d       76  %    FL/BPD:
 FL:      28.9  mm    G. Age:   18w 6d       61  %    FL/AC:      20.7   20 - 24
 HUM:     28.5  mm    G. Age:   19w 1d       75  %
 NFT:     3.27  mm

 Est. FW:     278  gm    0 lb 10 oz      58  %
Gestational Age

 LMP:           18w 3d       Date:   11/29/11                 EDD:   09/04/12
 U/S Today:     19w 2d                                        EDD:   08/29/12
 Best:          18w 3d    Det. By:   LMP  (11/29/11)          EDD:   09/04/12
2nd Trimester Genetic Sonogram - Trisomy 21 Screening
 Age:                                             32          Risk=1:   303

 Structural anomalies (inc. cardiac):             No
 Echogenic bowel:                                 No
 Hypoplastic / absent midphalanx 5th Digit:       No
 Wide space 7st-6nd toes:                         No
 Pyelectasis:                                     No
 2-vessel umbilical cord:                         No
 Echogenic cardiac foci:                          No
Anatomy

 Cranium:          Appears normal         Aortic Arch:      Appears normal
 Fetal Cavum:      Appears normal         Ductal Arch:      Appears normal
 Ventricles:       Appears normal         Diaphragm:        Appears normal
 Choroid Plexus:   Appears normal         Stomach:          Appears normal, left
                                                            sided
 Cerebellum:       Appears normal         Abdomen:          Appears normal
 Posterior Fossa:  Appears normal         Abdominal Wall:   Appears nml (cord
                                                            insert, abd wall)
 Nuchal Fold:      Appears normal         Cord Vessels:     Appears normal (3
                                                            vessel cord)
 Face:             Appears normal         Kidneys:          Appear normal
                   (orbits and profile)
 Lips:             Appears normal         Bladder:          Appears normal
 Palate:           Not well visualized    Spine:            Appears normal
 Heart:            Appears normal         Lower             Appears normal
                   (4CH, axis, and        Extremities:
                   situs)
 RVOT:             Appears normal         Upper             Appears normal
                                          Extremities:
 LVOT:             Appears normal

 Other:  Male gender. Nasal bone visualized. Left heel visualized.
Cervix Uterus Adnexa

 Cervical Length:   3.2       cm

 Cervix:       Normal appearance by transabdominal scan.
 Left Ovary:   Not visualized.
 Right Ovary:  Not visualized.

 Adnexa:     No abnormality visualized.
Myomas

 Site                     L(cm)      W(cm)      D(cm)       Location
 Left                     3.7        3.2        2.7         Intramural
 Left                     2.4        2.3        1.4         Intramural
 Left Fundus              5.8        5.3        4.1         Subserosal

 Blood Flow                  RI       PI       Comments
Impression

 Siup demonstrating an EGA by ultrasound of 19w 2d. This
 corresponds well with expected EGA by LMP of 18w 3d.

 No focal fetal or placental abnormalities are noted with a
 good anatomic evaluation possible. No soft markers for Down
 Syndrome are seen.

 No focal placental abnormalities are seen. Focal fibroid with
 sizes and locations as above.

 Subjectively and quantitatively normal amniotic fluid volume.

 Normal cervical length.

 questions or concerns.

## 2021-03-01 ENCOUNTER — Other Ambulatory Visit: Payer: Self-pay

## 2021-03-01 ENCOUNTER — Encounter (HOSPITAL_BASED_OUTPATIENT_CLINIC_OR_DEPARTMENT_OTHER): Payer: Self-pay

## 2021-03-01 ENCOUNTER — Emergency Department (HOSPITAL_BASED_OUTPATIENT_CLINIC_OR_DEPARTMENT_OTHER): Payer: BLUE CROSS/BLUE SHIELD

## 2021-03-01 ENCOUNTER — Emergency Department (HOSPITAL_BASED_OUTPATIENT_CLINIC_OR_DEPARTMENT_OTHER)
Admission: EM | Admit: 2021-03-01 | Discharge: 2021-03-01 | Disposition: A | Discharge status: Home / Self Care | Payer: BLUE CROSS/BLUE SHIELD | Attending: Emergency Medicine | Admitting: Emergency Medicine

## 2021-03-01 DIAGNOSIS — U071 COVID-19: Secondary | ICD-10-CM | POA: Diagnosis not present

## 2021-03-01 DIAGNOSIS — M79672 Pain in left foot: Secondary | ICD-10-CM | POA: Insufficient documentation

## 2021-03-01 DIAGNOSIS — R509 Fever, unspecified: Secondary | ICD-10-CM | POA: Diagnosis present

## 2021-03-01 DIAGNOSIS — Z91040 Latex allergy status: Secondary | ICD-10-CM | POA: Diagnosis not present

## 2021-03-01 LAB — RESP PANEL BY RT-PCR (FLU A&B, COVID) ARPGX2
Influenza A by PCR: NEGATIVE
Influenza B by PCR: NEGATIVE
SARS Coronavirus 2 by RT PCR: POSITIVE — AB

## 2021-03-01 LAB — CBC WITH DIFFERENTIAL/PLATELET
Abs Immature Granulocytes: 0.02 10*3/uL (ref 0.00–0.07)
Basophils Absolute: 0 10*3/uL (ref 0.0–0.1)
Basophils Relative: 0 %
Eosinophils Absolute: 0 10*3/uL (ref 0.0–0.5)
Eosinophils Relative: 0 %
HCT: 38.8 % (ref 36.0–46.0)
Hemoglobin: 12.5 g/dL (ref 12.0–15.0)
Immature Granulocytes: 0 %
Lymphocytes Relative: 10 %
Lymphs Abs: 0.8 10*3/uL (ref 0.7–4.0)
MCH: 25.3 pg — ABNORMAL LOW (ref 26.0–34.0)
MCHC: 32.2 g/dL (ref 30.0–36.0)
MCV: 78.5 fL — ABNORMAL LOW (ref 80.0–100.0)
Monocytes Absolute: 0.6 10*3/uL (ref 0.1–1.0)
Monocytes Relative: 8 %
Neutro Abs: 5.9 10*3/uL (ref 1.7–7.7)
Neutrophils Relative %: 82 %
Platelets: 364 10*3/uL (ref 150–400)
RBC: 4.94 MIL/uL (ref 3.87–5.11)
RDW: 14.9 % (ref 11.5–15.5)
WBC: 7.3 10*3/uL (ref 4.0–10.5)
nRBC: 0 % (ref 0.0–0.2)

## 2021-03-01 LAB — BASIC METABOLIC PANEL
Anion gap: 8 (ref 5–15)
BUN: 7 mg/dL (ref 6–20)
CO2: 23 mmol/L (ref 22–32)
Calcium: 8.5 mg/dL — ABNORMAL LOW (ref 8.9–10.3)
Chloride: 104 mmol/L (ref 98–111)
Creatinine, Ser: 0.84 mg/dL (ref 0.44–1.00)
GFR, Estimated: >60 mL/min (ref >60)
Glucose, Bld: 95 mg/dL (ref 70–99)
Potassium: 3.7 mmol/L (ref 3.5–5.1)
Sodium: 135 mmol/L (ref 135–145)

## 2021-03-01 MED ORDER — ACETAMINOPHEN 325 MG PO TABS
650.0000 mg | ORAL_TABLET | Freq: Once | ORAL | Status: AC | PRN
  Administered 2021-03-01: 650 mg via ORAL
  Filled 2021-03-01: qty 2

## 2021-03-01 NOTE — ED Triage Notes (Addendum)
Pt c/o left foot pain x 1 month, pain worse with walking. Pain to lateral foot and heel. Pt has fever of 101.7 during triage. States does not feel sick.

## 2021-03-03 NOTE — ED Provider Notes (Signed)
[facility] EMERGENCY DEPARTMENT Provider Note   CSN: [id number] Arrival date & time: 03/01/21  1737     History Chief Complaint  Patient presents with   Foot Pain    Rebekah Fowler is a 40 y.o. female.  HPI     40yo female who presents with concern for foot pain. Has had left foot pain for one month, worse with walking. Located lateral foot and heel. Initially was worse in AM but now is worse with walking all day.  Was not aware she had a fever. Works in a preschool. No cough, congestion, urinary symptoms. No injuries to foot.   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 Apgars 1/5/8  Bilateral tubal ligation with filshie clips.;  Surgeon: Donnamae Jude, MD;  [facility];  Service: Obstetrics;  Laterality: N/A;   NO PAST SURGERIES       OB History     Gravida  1   Para  1   Term  1   Preterm      AB      Living  1      SAB      IAB      Ectopic      Multiple      Live Births  1           Family History  Problem Relation Age of Onset   Hypertension Mother    Cancer Sister 33       breast   Diabetes Father     Social History   Tobacco Use   Smoking status: Never   Smokeless tobacco: Never  Substance Use Topics   Alcohol use: No   Drug use: No    Home Medications Prior to Admission medications   Medication Sig Start Date End Date Taking?  Authorizing Provider  docusate sodium 100 MG CAPS Take 100 mg by mouth 2 (two) times daily as needed. 08/21/12   Martha Clan, MD  ferrous sulfate 325 (65 FE) MG tablet Take 1 tablet (325 mg total) by mouth 3 (three) times daily with meals. 08/21/12   Martha Clan, MD  hydrochlorothiazide (HYDRODIURIL) 25 MG tablet Take 1 tablet (25 mg total) by mouth daily. 08/21/12   Martha Clan, MD  ibuprofen (ADVIL,MOTRIN) 600 MG tablet Take 1 tablet (600 mg total) by mouth every 6 (six) hours. 08/21/12   Martha Clan, MD  oxyCODONE-acetaminophen (PERCOCET/ROXICET) 5-325 MG per tablet Take 1-2 tablets by mouth every 4 (four) hours as needed. 08/21/12   Martha Clan, MD  Prenatal Vit-Fe Fumarate-FA (PRENATAL MULTIVITAMIN) TABS Take 1 tablet by mouth at bedtime.    [provider]    Allergies    Latex  Review of Systems   Review of Systems  Constitutional:  Positive for fever.  HENT:  Negative for sore throat.   Respiratory:  Negative for cough and  shortness of breath.   Cardiovascular:  Negative for chest pain.  Gastrointestinal:  Negative for abdominal pain, nausea and vomiting.  Genitourinary:  Negative for difficulty urinating.  Musculoskeletal:  Positive for arthralgias. Negative for back pain and neck pain.  Skin:  Negative for rash.  Neurological:  Negative for syncope and headaches.   Physical Exam Updated Vital Signs BP (!) 124/92 (BP Location: Right Arm)   Pulse 98   Temp 99.7 F (37.6 C) (Oral)   Resp 18   Ht 5\' 4"  (1.626 m)   Wt 124.7 kg   LMP 02/26/2021   SpO2 99%   BMI 47.20 kg/m   Physical Exam Vitals and nursing note reviewed.  Constitutional:      General: She is not in acute distress.    Appearance: Normal appearance. She is not ill-appearing, toxic-appearing or diaphoretic.  HENT:     Head: Normocephalic.  Eyes:     Conjunctiva/sclera: Conjunctivae normal.  Cardiovascular:     Rate and Rhythm: Normal rate and regular rhythm.     Pulses: Normal pulses.   Pulmonary:     Effort: Pulmonary effort is normal. No respiratory distress.  Musculoskeletal:        General: Tenderness (sole of foot, calcaneus, 5th MTP) present. No deformity or signs of injury.     Cervical back: No rigidity.  Skin:    General: Skin is warm and dry.     Coloration: Skin is not jaundiced or pale.  Neurological:     General: No focal deficit present.     Mental Status: She is alert and oriented to person, place, and time.    ED Results / Procedures / Treatments   Labs (all labs ordered are listed, but only abnormal results are displayed) Labs Reviewed  RESP PANEL BY RT-PCR (FLU A&B, COVID) ARPGX2 - Abnormal; Notable for the following components:      Result Value   SARS Coronavirus 2 by RT PCR POSITIVE (*)    All other components within normal limits  CBC WITH DIFFERENTIAL/PLATELET - Abnormal; Notable for the following components:   MCV 78.5 (*)    MCH 25.3 (*)    All other components within normal limits  BASIC METABOLIC PANEL - Abnormal; Notable for the following components:   Calcium 8.5 (*)    All other components within normal limits    EKG None  Radiology DG Foot Complete Left  Result Date: 03/01/2021 CLINICAL DATA:  Foot pain. EXAM: LEFT FOOT - COMPLETE 3+ VIEW COMPARISON:  None. FINDINGS: There is no acute fracture or dislocation. There is a plantar calcaneal spur. Joint spaces appear maintained. Soft tissues are within normal limits. IMPRESSION: 1. No acute bony abnormality. Electronically Signed   By: Ronney Asters M.D.   On: 03/01/2021 18:36    Procedures Procedures   Medications Ordered in ED Medications  acetaminophen (TYLENOL) tablet 650 mg (650 mg Oral Given 03/01/21 1800)    ED Course  I have reviewed the triage vital signs and the nursing notes.  Pertinent labs & imaging results that were available during my care of the patient were reviewed by me and considered in my medical decision making (see chart for details).    MDM  Rules/Calculators/A&P                            40yo female who presents with concern for foot pain.  Normal pulse, no sign of acute arterial thrombus. No  swelling, pain localized to bottom of foot/heel, low suspicion for DVT.  No sign of erythema or indication of infection on exam. Has a fever but feel it is also likely this is from other viral etiology given potential exposures working at a preschool.  Does not have other symptoms. Given post op shoe, crutches, recommend ibuprofen/tylenol for pain, follow up with PCP, consideration of MRI if symptoms continue. COVID flu testing ordered and pending.    Final Clinical Impression(s) / ED Diagnoses Final diagnoses:  Left foot pain  Fever, unspecified fever cause    Rx / DC Orders ED Discharge Orders     None        Gareth Morgan, MD 03/03/21 1101
